# Patient Record
Sex: Female | Born: 1935
Health system: Southern US, Community
[De-identification: ages and names within clinical notes are randomized; demographics above are authoritative.]

## PROBLEM LIST (undated history)

## (undated) ENCOUNTER — Emergency Department (HOSPITAL_COMMUNITY): Admission: EM | Payer: Medicare Other | Source: Home / Self Care

## (undated) DIAGNOSIS — R0602 Shortness of breath: Secondary | ICD-10-CM

## (undated) DIAGNOSIS — M1990 Unspecified osteoarthritis, unspecified site: Secondary | ICD-10-CM

## (undated) DIAGNOSIS — T8859XA Other complications of anesthesia, initial encounter: Secondary | ICD-10-CM

## (undated) DIAGNOSIS — M1611 Unilateral primary osteoarthritis, right hip: Secondary | ICD-10-CM

## (undated) DIAGNOSIS — F419 Anxiety disorder, unspecified: Secondary | ICD-10-CM

## (undated) DIAGNOSIS — T4145XA Adverse effect of unspecified anesthetic, initial encounter: Secondary | ICD-10-CM

## (undated) DIAGNOSIS — K219 Gastro-esophageal reflux disease without esophagitis: Secondary | ICD-10-CM

## (undated) DIAGNOSIS — M1710 Unilateral primary osteoarthritis, unspecified knee: Secondary | ICD-10-CM

## (undated) DIAGNOSIS — R42 Dizziness and giddiness: Secondary | ICD-10-CM

## (undated) DIAGNOSIS — H6690 Otitis media, unspecified, unspecified ear: Secondary | ICD-10-CM

## (undated) DIAGNOSIS — S52502A Unspecified fracture of the lower end of left radius, initial encounter for closed fracture: Secondary | ICD-10-CM

## (undated) HISTORY — PX: EYE SURGERY: SHX253

## (undated) HISTORY — PX: BACK SURGERY: SHX140

---

## 2001-01-20 ENCOUNTER — Encounter: Payer: Self-pay | Admitting: Emergency Medicine

## 2001-01-20 ENCOUNTER — Emergency Department (HOSPITAL_COMMUNITY): Admission: EM | Admit: 2001-01-20 | Discharge: 2001-01-21 | Payer: Self-pay | Admitting: Emergency Medicine

## 2003-10-17 ENCOUNTER — Ambulatory Visit (HOSPITAL_COMMUNITY): Admission: RE | Admit: 2003-10-17 | Discharge: 2003-10-17 | Payer: Self-pay | Admitting: Family Medicine

## 2003-12-31 ENCOUNTER — Ambulatory Visit (HOSPITAL_COMMUNITY): Admission: RE | Admit: 2003-12-31 | Discharge: 2003-12-31 | Payer: Self-pay | Admitting: Ophthalmology

## 2005-11-17 ENCOUNTER — Emergency Department (HOSPITAL_COMMUNITY): Admission: EM | Admit: 2005-11-17 | Discharge: 2005-11-17 | Payer: Self-pay | Admitting: Emergency Medicine

## 2005-12-01 ENCOUNTER — Ambulatory Visit (HOSPITAL_COMMUNITY): Admission: RE | Admit: 2005-12-01 | Discharge: 2005-12-01 | Payer: Self-pay | Admitting: Family Medicine

## 2007-06-23 ENCOUNTER — Ambulatory Visit (HOSPITAL_COMMUNITY): Admission: RE | Admit: 2007-06-23 | Discharge: 2007-06-23 | Payer: Self-pay | Admitting: Ophthalmology

## 2008-03-18 ENCOUNTER — Emergency Department (HOSPITAL_COMMUNITY): Admission: EM | Admit: 2008-03-18 | Discharge: 2008-03-19 | Payer: Self-pay | Admitting: Emergency Medicine

## 2010-03-28 ENCOUNTER — Emergency Department (HOSPITAL_COMMUNITY): Admission: EM | Admit: 2010-03-28 | Discharge: 2010-03-29 | Payer: Self-pay | Admitting: Emergency Medicine

## 2010-07-15 LAB — URINALYSIS, ROUTINE W REFLEX MICROSCOPIC
Bilirubin Urine: NEGATIVE
Glucose, UA: NEGATIVE mg/dL
Specific Gravity, Urine: 1.02 (ref 1.005–1.030)
pH: 5.5 (ref 5.0–8.0)

## 2010-07-15 LAB — BASIC METABOLIC PANEL
Chloride: 102 mEq/L (ref 96–112)
GFR calc non Af Amer: 53 mL/min — ABNORMAL LOW (ref >60)
Glucose, Bld: 128 mg/dL — ABNORMAL HIGH (ref 70–99)
Potassium: 3.8 mEq/L (ref 3.5–5.1)
Sodium: 137 mEq/L (ref 135–145)

## 2010-07-15 LAB — POCT CARDIAC MARKERS: Myoglobin, poc: 60.6 ng/mL (ref 12–200)

## 2010-07-15 LAB — CBC
HCT: 44.2 % (ref 36.0–46.0)
Hemoglobin: 15.5 g/dL — ABNORMAL HIGH (ref 12.0–15.0)
WBC: 9.4 10*3/uL (ref 4.0–10.5)

## 2010-07-15 LAB — URINE MICROSCOPIC-ADD ON

## 2010-07-15 LAB — DIFFERENTIAL
Lymphocytes Relative: 23 % (ref 12–46)
Monocytes Absolute: 0.6 10*3/uL (ref 0.1–1.0)
Monocytes Relative: 6 % (ref 3–12)
Neutro Abs: 6.2 10*3/uL (ref 1.7–7.7)

## 2011-01-23 LAB — BASIC METABOLIC PANEL
CO2: 31
Calcium: 10.4
Glucose, Bld: 103 — ABNORMAL HIGH
Sodium: 142

## 2011-02-03 LAB — URINALYSIS, ROUTINE W REFLEX MICROSCOPIC
Bilirubin Urine: NEGATIVE
Hgb urine dipstick: NEGATIVE
Ketones, ur: NEGATIVE
Protein, ur: NEGATIVE
Urobilinogen, UA: 0.2

## 2011-02-03 LAB — CBC
Hemoglobin: 14.4
MCHC: 34
MCV: 91.3
RBC: 4.65

## 2011-02-03 LAB — DIFFERENTIAL
Basophils Relative: 1
Eosinophils Absolute: 0.4
Eosinophils Relative: 6 — ABNORMAL HIGH
Monocytes Absolute: 0.5
Monocytes Relative: 8
Neutrophils Relative %: 52

## 2011-02-03 LAB — BASIC METABOLIC PANEL
CO2: 26
Chloride: 104
Creatinine, Ser: 1.07
GFR calc Af Amer: >60

## 2011-04-16 ENCOUNTER — Ambulatory Visit (HOSPITAL_COMMUNITY)
Admission: RE | Admit: 2011-04-16 | Discharge: 2011-04-16 | Disposition: A | Discharge status: Home / Self Care | Payer: Medicare Other | Source: Ambulatory Visit | Attending: Family Medicine | Admitting: Family Medicine

## 2011-04-16 ENCOUNTER — Other Ambulatory Visit (HOSPITAL_COMMUNITY): Payer: Self-pay | Admitting: Family Medicine

## 2011-04-16 DIAGNOSIS — Z23 Encounter for immunization: Secondary | ICD-10-CM

## 2011-04-16 DIAGNOSIS — I10 Essential (primary) hypertension: Secondary | ICD-10-CM

## 2011-04-16 DIAGNOSIS — E785 Hyperlipidemia, unspecified: Secondary | ICD-10-CM

## 2011-04-16 DIAGNOSIS — M899 Disorder of bone, unspecified: Secondary | ICD-10-CM | POA: Insufficient documentation

## 2011-04-16 DIAGNOSIS — Z780 Asymptomatic menopausal state: Secondary | ICD-10-CM | POA: Insufficient documentation

## 2011-04-16 DIAGNOSIS — M949 Disorder of cartilage, unspecified: Secondary | ICD-10-CM | POA: Insufficient documentation

## 2011-06-19 DIAGNOSIS — H26499 Other secondary cataract, unspecified eye: Secondary | ICD-10-CM | POA: Diagnosis not present

## 2011-06-19 DIAGNOSIS — H5200 Hypermetropia, unspecified eye: Secondary | ICD-10-CM | POA: Diagnosis not present

## 2011-06-19 DIAGNOSIS — H52229 Regular astigmatism, unspecified eye: Secondary | ICD-10-CM | POA: Diagnosis not present

## 2011-06-19 DIAGNOSIS — H524 Presbyopia: Secondary | ICD-10-CM | POA: Diagnosis not present

## 2011-07-20 DIAGNOSIS — I10 Essential (primary) hypertension: Secondary | ICD-10-CM | POA: Diagnosis not present

## 2011-07-20 DIAGNOSIS — E785 Hyperlipidemia, unspecified: Secondary | ICD-10-CM | POA: Diagnosis not present

## 2011-08-17 DIAGNOSIS — M169 Osteoarthritis of hip, unspecified: Secondary | ICD-10-CM | POA: Diagnosis not present

## 2011-08-17 DIAGNOSIS — Z6829 Body mass index (BMI) 29.0-29.9, adult: Secondary | ICD-10-CM | POA: Diagnosis not present

## 2011-08-17 DIAGNOSIS — E782 Mixed hyperlipidemia: Secondary | ICD-10-CM | POA: Diagnosis not present

## 2011-08-17 DIAGNOSIS — M25559 Pain in unspecified hip: Secondary | ICD-10-CM | POA: Diagnosis not present

## 2012-01-19 DIAGNOSIS — E782 Mixed hyperlipidemia: Secondary | ICD-10-CM | POA: Diagnosis not present

## 2012-02-25 ENCOUNTER — Other Ambulatory Visit (HOSPITAL_COMMUNITY): Payer: Self-pay | Admitting: Family Medicine

## 2012-02-25 ENCOUNTER — Ambulatory Visit (HOSPITAL_COMMUNITY)
Admission: RE | Admit: 2012-02-25 | Discharge: 2012-02-25 | Disposition: A | Discharge status: Home / Self Care | Payer: Medicare Other | Source: Ambulatory Visit | Attending: Family Medicine | Admitting: Family Medicine

## 2012-02-25 DIAGNOSIS — M51379 Other intervertebral disc degeneration, lumbosacral region without mention of lumbar back pain or lower extremity pain: Secondary | ICD-10-CM | POA: Insufficient documentation

## 2012-02-25 DIAGNOSIS — M169 Osteoarthritis of hip, unspecified: Secondary | ICD-10-CM | POA: Diagnosis not present

## 2012-02-25 DIAGNOSIS — M25559 Pain in unspecified hip: Secondary | ICD-10-CM

## 2012-02-25 DIAGNOSIS — E119 Type 2 diabetes mellitus without complications: Secondary | ICD-10-CM | POA: Diagnosis not present

## 2012-02-25 DIAGNOSIS — M5137 Other intervertebral disc degeneration, lumbosacral region: Secondary | ICD-10-CM | POA: Insufficient documentation

## 2012-02-25 DIAGNOSIS — Z23 Encounter for immunization: Secondary | ICD-10-CM | POA: Diagnosis not present

## 2012-02-29 ENCOUNTER — Other Ambulatory Visit: Payer: Self-pay | Admitting: Sports Medicine

## 2012-02-29 DIAGNOSIS — M169 Osteoarthritis of hip, unspecified: Secondary | ICD-10-CM

## 2012-02-29 DIAGNOSIS — M545 Low back pain, unspecified: Secondary | ICD-10-CM | POA: Diagnosis not present

## 2012-03-03 ENCOUNTER — Ambulatory Visit
Admission: RE | Admit: 2012-03-03 | Discharge: 2012-03-03 | Disposition: A | Discharge status: Home / Self Care | Payer: Medicare Other | Source: Ambulatory Visit | Attending: Sports Medicine | Admitting: Sports Medicine

## 2012-03-03 DIAGNOSIS — M1610 Unilateral primary osteoarthritis, unspecified hip: Secondary | ICD-10-CM | POA: Diagnosis not present

## 2012-03-03 DIAGNOSIS — M169 Osteoarthritis of hip, unspecified: Secondary | ICD-10-CM

## 2012-03-03 DIAGNOSIS — M25559 Pain in unspecified hip: Secondary | ICD-10-CM | POA: Diagnosis not present

## 2012-03-03 MED ORDER — METHYLPREDNISOLONE ACETATE 40 MG/ML INJ SUSP (RADIOLOG
120.0000 mg | Freq: Once | INTRAMUSCULAR | Status: AC
  Administered 2012-03-03: 120 mg via INTRA_ARTICULAR

## 2012-03-03 MED ORDER — IOHEXOL 180 MG/ML  SOLN
1.0000 mL | Freq: Once | INTRAMUSCULAR | Status: AC | PRN
  Administered 2012-03-03: 1 mL via INTRA_ARTICULAR

## 2012-03-22 ENCOUNTER — Other Ambulatory Visit: Payer: Self-pay | Admitting: Sports Medicine

## 2012-03-22 DIAGNOSIS — M169 Osteoarthritis of hip, unspecified: Secondary | ICD-10-CM

## 2012-04-19 DIAGNOSIS — I10 Essential (primary) hypertension: Secondary | ICD-10-CM | POA: Diagnosis not present

## 2012-04-19 DIAGNOSIS — E785 Hyperlipidemia, unspecified: Secondary | ICD-10-CM | POA: Diagnosis not present

## 2012-04-19 DIAGNOSIS — Z6829 Body mass index (BMI) 29.0-29.9, adult: Secondary | ICD-10-CM | POA: Diagnosis not present

## 2012-04-19 DIAGNOSIS — R7301 Impaired fasting glucose: Secondary | ICD-10-CM | POA: Diagnosis not present

## 2012-05-03 ENCOUNTER — Ambulatory Visit
Admission: RE | Admit: 2012-05-03 | Discharge: 2012-05-03 | Disposition: A | Discharge status: Home / Self Care | Payer: Medicare Other | Source: Ambulatory Visit | Attending: Sports Medicine | Admitting: Sports Medicine

## 2012-05-03 DIAGNOSIS — M169 Osteoarthritis of hip, unspecified: Secondary | ICD-10-CM

## 2012-05-03 DIAGNOSIS — M25559 Pain in unspecified hip: Secondary | ICD-10-CM | POA: Diagnosis not present

## 2012-05-03 MED ORDER — IOHEXOL 180 MG/ML  SOLN
1.0000 mL | Freq: Once | INTRAMUSCULAR | Status: AC | PRN
  Administered 2012-05-03: 1 mL via INTRA_ARTICULAR

## 2012-05-03 MED ORDER — METHYLPREDNISOLONE ACETATE 40 MG/ML INJ SUSP (RADIOLOG
120.0000 mg | Freq: Once | INTRAMUSCULAR | Status: AC
  Administered 2012-05-03: 120 mg via INTRA_ARTICULAR

## 2012-06-23 DIAGNOSIS — M169 Osteoarthritis of hip, unspecified: Secondary | ICD-10-CM | POA: Diagnosis not present

## 2012-07-04 ENCOUNTER — Emergency Department (HOSPITAL_COMMUNITY): Payer: Medicare Other

## 2012-07-04 ENCOUNTER — Encounter (HOSPITAL_COMMUNITY): Payer: Self-pay | Admitting: *Deleted

## 2012-07-04 ENCOUNTER — Emergency Department (HOSPITAL_COMMUNITY)
Admission: EM | Admit: 2012-07-04 | Discharge: 2012-07-04 | Disposition: A | Discharge status: Home / Self Care | Payer: Medicare Other | Source: Home / Self Care | Attending: Emergency Medicine | Admitting: Emergency Medicine

## 2012-07-04 DIAGNOSIS — Z79899 Other long term (current) drug therapy: Secondary | ICD-10-CM | POA: Diagnosis not present

## 2012-07-04 DIAGNOSIS — G4733 Obstructive sleep apnea (adult) (pediatric): Secondary | ICD-10-CM | POA: Diagnosis not present

## 2012-07-04 DIAGNOSIS — R52 Pain, unspecified: Secondary | ICD-10-CM | POA: Diagnosis not present

## 2012-07-04 DIAGNOSIS — Z4789 Encounter for other orthopedic aftercare: Secondary | ICD-10-CM | POA: Diagnosis not present

## 2012-07-04 DIAGNOSIS — S52599A Other fractures of lower end of unspecified radius, initial encounter for closed fracture: Secondary | ICD-10-CM | POA: Diagnosis not present

## 2012-07-04 DIAGNOSIS — Z7901 Long term (current) use of anticoagulants: Secondary | ICD-10-CM | POA: Diagnosis not present

## 2012-07-04 DIAGNOSIS — R262 Difficulty in walking, not elsewhere classified: Secondary | ICD-10-CM | POA: Diagnosis not present

## 2012-07-04 DIAGNOSIS — S5290XA Unspecified fracture of unspecified forearm, initial encounter for closed fracture: Secondary | ICD-10-CM | POA: Diagnosis not present

## 2012-07-04 DIAGNOSIS — M1610 Unilateral primary osteoarthritis, unspecified hip: Secondary | ICD-10-CM | POA: Diagnosis present

## 2012-07-04 DIAGNOSIS — Z471 Aftercare following joint replacement surgery: Secondary | ICD-10-CM | POA: Diagnosis not present

## 2012-07-04 DIAGNOSIS — W19XXXA Unspecified fall, initial encounter: Secondary | ICD-10-CM | POA: Diagnosis not present

## 2012-07-04 DIAGNOSIS — S52502A Unspecified fracture of the lower end of left radius, initial encounter for closed fracture: Secondary | ICD-10-CM

## 2012-07-04 DIAGNOSIS — R279 Unspecified lack of coordination: Secondary | ICD-10-CM | POA: Diagnosis not present

## 2012-07-04 DIAGNOSIS — S52539A Colles' fracture of unspecified radius, initial encounter for closed fracture: Secondary | ICD-10-CM | POA: Diagnosis not present

## 2012-07-04 DIAGNOSIS — Z01818 Encounter for other preprocedural examination: Secondary | ICD-10-CM | POA: Diagnosis not present

## 2012-07-04 DIAGNOSIS — Z96649 Presence of unspecified artificial hip joint: Secondary | ICD-10-CM | POA: Diagnosis not present

## 2012-07-04 DIAGNOSIS — M25539 Pain in unspecified wrist: Secondary | ICD-10-CM | POA: Diagnosis not present

## 2012-07-04 DIAGNOSIS — IMO0002 Reserved for concepts with insufficient information to code with codable children: Secondary | ICD-10-CM | POA: Diagnosis not present

## 2012-07-04 HISTORY — DX: Unspecified osteoarthritis, unspecified site: M19.90

## 2012-07-04 MED ORDER — OXYCODONE-ACETAMINOPHEN 5-325 MG PO TABS
1.0000 | ORAL_TABLET | Freq: Once | ORAL | Status: AC
  Administered 2012-07-04: 1 via ORAL
  Filled 2012-07-04: qty 1

## 2012-07-04 MED ORDER — OXYCODONE-ACETAMINOPHEN 5-325 MG PO TABS: 1.0000 | ORAL_TABLET | ORAL | Status: DC | PRN

## 2012-07-04 MED ORDER — FENTANYL CITRATE 0.05 MG/ML IJ SOLN
100.0000 ug | Freq: Once | INTRAMUSCULAR | Status: AC
  Administered 2012-07-04: 50 ug via NASAL
  Filled 2012-07-04: qty 2

## 2012-07-04 NOTE — ED Provider Notes (Signed)
History     CSN: [id number]  Arrival date & time 07/04/12  1454   First MD Initiated Contact with Patient 07/04/12 1530      Chief Complaint  Patient presents with  . Wrist Injury     Patient is a 79 y.o. female presenting with wrist injury. The history is provided by the patient.  Wrist Injury Location:  Wrist Time since incident:  2 hours Wrist location:  L wrist Pain details:    Quality:  Aching   Radiates to:  Does not radiate   Severity:  Moderate   Onset quality:  Sudden   Timing:  Constant   Progression:  Worsening Chronicity:  New Dislocation: no   Relieved by:  Rest Worsened by:  Movement Associated symptoms: no back pain, no fatigue and no neck pain   pt reports she was getting off couch and fell.  No head or neck injury.  No cp/sob.  No dizziness reported.  She reports she injured her left wrist.  She is scheduled to have hip surgery later this month  Past Medical History  Diagnosis Date  . Arthritis     Past Surgical History  Procedure Laterality Date  . Back surgery      No family history on file.  History  Substance Use Topics  . Smoking status: Never Smoker   . Smokeless tobacco: Not on file  . Alcohol Use: No    OB History   Grav Para Term Preterm Abortions TAB SAB Ect Mult Living                  Review of Systems  Constitutional: Negative for fatigue.  HENT: Negative for neck pain.   Respiratory: Negative for shortness of breath.   Cardiovascular: Negative for chest pain.  Gastrointestinal: Negative for abdominal pain.  Musculoskeletal: Negative for back pain.  Neurological: Negative for dizziness, syncope and weakness.  Psychiatric/Behavioral: Negative for agitation.  All other systems reviewed and are negative.    Allergies  Review of patient's allergies indicates no known allergies.  Home Medications  No current outpatient prescriptions on file.  BP 156/65  Pulse 110  Temp(Src) 97.9 F (36.6 C) (Oral)  Resp 18  Ht  5\' 6"  (1.676 m)  Wt 180 lb (81.647 kg)  BMI 29.07 kg/m2  SpO2 98%  Physical Exam CONSTITUTIONAL: Well developed/well nourished HEAD: Normocephalic/atraumatic EYES: EOMI/PERRL ENMT: Mucous membranes moist NECK: supple no meningeal signs SPINE:entire spine nontender CV: S1/S2 noted, no murmurs/rubs/gallops noted LUNGS: Lungs are clear to auscultation bilaterally, no apparent distress ABDOMEN: soft, nontender, no rebound or guarding GU:no cva tenderness NEURO: Pt is awake/alert, moves all extremitiesx4 EXTREMITIES: pulses normal, full ROM. Left wrist tenderness and deformity noted.  No laceration or abrasions.  Pulses are intact.  Distal radial/media/ulnar motor/sensory intact on left UE.  All other extremities/joints palpated/ranged and nontender SKIN: warm, color normal PSYCH: no abnormalities of mood noted  ED Course  Procedures (including critical care time)  Labs Reviewed - No data to display Dg Wrist Complete Left  07/04/2012  *RADIOLOGY REPORT*  Clinical Data: Injury, left wrist pain post fall today  LEFT WRIST - COMPLETE 3+ VIEW  Comparison: None  Findings: Osseous demineralization. Transverse metaphyseal fracture distal left radius with dorsal displacement and apex volar angulation. Probable intra-articular extension into radiocarpal joint near dorsal margin. No additional fracture or dislocation identified. Degenerative changes at the STT joint. Acquired ulnar plus variance.  IMPRESSION: Displaced angulated and likely intra-articular distal right radial metaphyseal fracture.  Original Report Authenticated By: Ulyses Southward, M.D.      No diagnosis found.    MDM  xrays reviewed and considered Nursing notes including past medical history and social history reviewed and considered in documentation   Pt does have orthopedist in [place] but she requested I speak to local orthopedist due to poor weather and she feels she can not get to [place] I spoke to dr Hilda Lias who reviewed  imaging.  He requests I place sugartong and he will see in the morning.  He does not feel acute reduction is needed.   Pt tolerated sugartong well.  No other injury noted.  No new hip injury noted.  Denies LOC and this was likely mechanical fall.         Joya Gaskins, MD 07/04/12 [phone]

## 2012-07-04 NOTE — ED Notes (Signed)
Fell off sofa at home, left wrist pain

## 2012-07-05 ENCOUNTER — Inpatient Hospital Stay (HOSPITAL_COMMUNITY): Payer: Medicare Other

## 2012-07-05 ENCOUNTER — Inpatient Hospital Stay (HOSPITAL_COMMUNITY)
Admission: AD | Admit: 2012-07-05 | Discharge: 2012-07-11 | DRG: 470 | Disposition: A | Discharge status: Skilled Nursing Facility | Payer: Medicare Other | Source: Ambulatory Visit | Attending: Orthopedic Surgery | Admitting: Orthopedic Surgery

## 2012-07-05 ENCOUNTER — Encounter (HOSPITAL_COMMUNITY): Payer: Self-pay | Admitting: Neurology

## 2012-07-05 ENCOUNTER — Other Ambulatory Visit: Payer: Self-pay | Admitting: Orthopedic Surgery

## 2012-07-05 DIAGNOSIS — M1612 Unilateral primary osteoarthritis, left hip: Secondary | ICD-10-CM | POA: Diagnosis present

## 2012-07-05 DIAGNOSIS — S52502A Unspecified fracture of the lower end of left radius, initial encounter for closed fracture: Secondary | ICD-10-CM

## 2012-07-05 DIAGNOSIS — S52539A Colles' fracture of unspecified radius, initial encounter for closed fracture: Secondary | ICD-10-CM | POA: Diagnosis not present

## 2012-07-05 DIAGNOSIS — Z79899 Other long term (current) drug therapy: Secondary | ICD-10-CM

## 2012-07-05 DIAGNOSIS — Z01818 Encounter for other preprocedural examination: Secondary | ICD-10-CM | POA: Diagnosis not present

## 2012-07-05 DIAGNOSIS — W010XXA Fall on same level from slipping, tripping and stumbling without subsequent striking against object, initial encounter: Secondary | ICD-10-CM | POA: Diagnosis present

## 2012-07-05 DIAGNOSIS — S52599A Other fractures of lower end of unspecified radius, initial encounter for closed fracture: Principal | ICD-10-CM | POA: Diagnosis present

## 2012-07-05 DIAGNOSIS — M1610 Unilateral primary osteoarthritis, unspecified hip: Secondary | ICD-10-CM | POA: Diagnosis not present

## 2012-07-05 DIAGNOSIS — Z7901 Long term (current) use of anticoagulants: Secondary | ICD-10-CM

## 2012-07-05 HISTORY — DX: Unilateral primary osteoarthritis, unspecified knee: M17.10

## 2012-07-05 HISTORY — DX: Unspecified fracture of the lower end of left radius, initial encounter for closed fracture: S52.502A

## 2012-07-05 LAB — BASIC METABOLIC PANEL
BUN: 17 mg/dL (ref 6–23)
Chloride: 102 mEq/L (ref 96–112)
Creatinine, Ser: 0.98 mg/dL (ref 0.50–1.10)
Glucose, Bld: 122 mg/dL — ABNORMAL HIGH (ref 70–99)
Potassium: 3.7 mEq/L (ref 3.5–5.1)

## 2012-07-05 LAB — CBC
HCT: 43.6 % (ref 36.0–46.0)
Hemoglobin: 15.1 g/dL — ABNORMAL HIGH (ref 12.0–15.0)
MCH: 30.9 pg (ref 26.0–34.0)
MCHC: 34.6 g/dL (ref 30.0–36.0)
MCV: 89.2 fL (ref 78.0–100.0)
RDW: 13.2 % (ref 11.5–15.5)

## 2012-07-05 MED ORDER — POTASSIUM CHLORIDE IN NACL 20-0.45 MEQ/L-% IV SOLN
INTRAVENOUS | Status: DC
  Administered 2012-07-05 – 2012-07-07 (×2): via INTRAVENOUS
  Filled 2012-07-05 (×5): qty 1000

## 2012-07-05 MED ORDER — ACETAMINOPHEN 650 MG RE SUPP: 650.0000 mg | Freq: Four times a day (QID) | RECTAL | Status: DC | PRN

## 2012-07-05 MED ORDER — CEFAZOLIN SODIUM-DEXTROSE 2-3 GM-% IV SOLR
2.0000 g | INTRAVENOUS | Status: AC
  Administered 2012-07-07: 2 g via INTRAVENOUS
  Filled 2012-07-05: qty 50

## 2012-07-05 MED ORDER — ACETAMINOPHEN 325 MG PO TABS: 650.0000 mg | ORAL_TABLET | Freq: Four times a day (QID) | ORAL | Status: DC | PRN

## 2012-07-05 MED ORDER — HYDROCODONE-ACETAMINOPHEN 5-325 MG PO TABS
1.0000 | ORAL_TABLET | ORAL | Status: DC | PRN
  Administered 2012-07-05 – 2012-07-06 (×6): 2 via ORAL
  Filled 2012-07-05 (×6): qty 2

## 2012-07-05 MED ORDER — MORPHINE SULFATE 2 MG/ML IJ SOLN
1.0000 mg | INTRAMUSCULAR | Status: DC | PRN
  Administered 2012-07-05 – 2012-07-06 (×5): 1 mg via INTRAVENOUS
  Filled 2012-07-05 (×6): qty 1

## 2012-07-05 NOTE — H&P (Signed)
  PREOPERATIVE H&P  Chief Complaint: left wrist pain, back pain, bilateral hip arthritis  HPI: Ruth Ford is a 79 y.o. female who fell off a sofa last  Night with 10/10 pain over her back, left wrist, with chronic bilateral hip pain.  She lives alone and can not care for herself and has been taking hydrocodone with inadequate pain control.  Denies LOC.  Wishes inpatient management for pain control and assistance with ambulation.  Past Medical History  Diagnosis Date  . Arthritis    Past Surgical History  Procedure Laterality Date  . Back surgery     History   Social History  . Marital Status: Divorced    Spouse Name: N/A    Number of Children: N/A  . Years of Education: N/A   Social History Main Topics  . Smoking status: Never Smoker   . Smokeless tobacco: Not on file  . Alcohol Use: No  . Drug Use: Not on file  . Sexually Active: Not on file   Other Topics Concern  . Not on file   Social History Narrative  . No narrative on file   No family history on file. No Known Allergies Prior to Admission medications   Medication Sig Start Date End Date Taking? Authorizing Maridee Slape  famotidine-calcium carbonate-magnesium hydroxide (PEPCID COMPLETE) [phone] MG CHEW Chew 1 tablet by mouth daily as needed.    Historical Briseyda Fehr, MD  fish oil-omega-3 fatty acids 1000 MG capsule Take 1-2 g by mouth daily.    Historical Otis Portal, MD  hydrochlorothiazide (HYDRODIURIL) 25 MG tablet Take 25 mg by mouth daily.    Historical Cachet Mccutchen, MD  oxyCODONE-acetaminophen (PERCOCET/ROXICET) 5-325 MG per tablet Take 1 tablet by mouth every 4 (four) hours as needed for pain. 07/04/12   Joya Gaskins, MD     Positive ROS: All other systems have been reviewed and were otherwise negative with the exception of those mentioned in the HPI and as above.  Physical Exam: General: Alert, no acute distress Cardiovascular: No pedal edema Respiratory: No cyanosis, no use of accessory musculature GI:  No organomegaly, abdomen is soft and non-tender Skin: No lesions in the area of chief complaint Neurologic: Sensation intact distally Psychiatric: Patient is competent for consent with normal mood and affect Lymphatic: No axillary or cervical lymphadenopathy  MUSCULOSKELETAL: left wrist has gross deformity, sensation intact, severe pain to palpation, back has severe pain in midline, pain with restricted ROM of both hips.  Assessment: Comminuted displaced distal radius fracture, bilateral hip OA, severe uncontrolled pain on po meds, back pain, possible nondisplaced coccyx fracture, failure to thrive at home.  Plan: Plan for admission, IV pain meds for pain control, npo after midnight add on for ORIF wrist tomorrow.  PT/OT for ambulation and safety.  The risks benefits and alternatives were discussed with the patient including but not limited to the risks of nonoperative treatment, versus surgical intervention including infection, bleeding, nerve injury,  blood clots, cardiopulmonary complications, morbidity, mortality, among others, and they were willing to proceed.   LANDAU,JOSHUA P, MD Cell [phone] Pager [phone]  07/05/2012 5:14 PM

## 2012-07-06 MED ORDER — ENOXAPARIN SODIUM 40 MG/0.4ML ~~LOC~~ SOLN
40.0000 mg | SUBCUTANEOUS | Status: AC
  Administered 2012-07-06: 40 mg via SUBCUTANEOUS
  Filled 2012-07-06: qty 0.4

## 2012-07-06 NOTE — Progress Notes (Signed)
     Subjective:  Patient reports pain as marked.  She says that her fall was because she was not able to balance due to her hip arthritis. She has severe hip pain as well as wrist pain.  Objective:   VITALS:   Filed Vitals:   07/05/12 1944 07/05/12 2110 07/06/12 0643  BP: 146/67 137/77 110/56  Pulse: 97 100 86  Temp: 98.2 F (36.8 C) 98 F (36.7 C) 98.2 F (36.8 C)  TempSrc: Oral Oral Oral  Resp: 18 18 16   Height: 5\' 5"  (1.651 m)    Weight: 83.3 kg (183 lb 10.3 oz)    SpO2: 97% 98% 94%    Neurologically intact Dorsiflexion/Plantar flexion intact   Lab Results  Component Value Date   WBC 11.7* 07/05/2012   HGB 15.1* 07/05/2012   HCT 43.6 07/05/2012   MCV 89.2 07/05/2012   PLT 230 07/05/2012     Assessment/Plan:     Severe hip osteoarthritis, acute distal radius fracture, back pain, inability to care for herself at home.  She was originally scheduled to have her hip replaced in approximately 2 weeks. Her hip arthritis is impairing her mobility, and has also increased her risk for falls, resulting in this fracture. We discussed the different options. She would like to get her hip fixed as well as her wrist fixed at the same time, and optimize her long-term recovery. I have discussed that she is at risk for falls, but will be at risk for falls either way.  The risks benefits and alternatives were discussed with the patient including but not limited to the risks of nonoperative treatment, versus surgical intervention including infection, bleeding, nerve injury, periprosthetic fracture, the need for revision surgery, dislocation, leg length discrepancy, blood clots, cardiopulmonary complications, morbidity, mortality, among others, and they were willing to proceed.  We've also discussed the risks for posttraumatic arthritis of the wrist, as well as hardware complications, tendon rupture, among others. She would like to proceed.  We are going to schedule surgery for tomorrow, probably  approximately 11 AM. We will give her 1 dose of Lovenox today, and have her be n.p.o. after midnight tonight.      LANDAU,JOSHUA P 07/06/2012, 8:41 AM   Teryl Lucy, MD Cell [phone] Pager [phone]

## 2012-07-06 NOTE — Evaluation (Signed)
Physical Therapy Evaluation Patient Details Name: Ruth Ford MRN: [id number] DOB: May 28, 1933 Today's Date: 07/06/2012 Time: 4098-1191 PT Time Calculation (min): 23 min  PT Assessment / Plan / Recommendation Clinical Impression  Pt is 79 yo female who fell off a couch and suffered L distal radius fx. Pt was also schedule for L THA at end of month due to severe arthrits. Pt mobilizing very well this date. Pt planned to go to OR tomorrow 3/6 for ORIF to L wrist and L THA. PT to re-assess patient on 3/7 or when approved by MD. Pt will most likely need a platform walker and rehab upon d/c to achieve mod I function prior to returning home alone. Pt motivated and anticipate pt to do well after surgery.    PT Assessment  Patient needs continued PT services    Follow Up Recommendations  Supervision/Assistance - 24 hour;SNF    Does the patient have the potential to tolerate intense rehabilitation      Barriers to Discharge Decreased caregiver support pt lives alone    Equipment Recommendations   (TBD)    Recommendations for Other Services     Frequency Min 5X/week    Precautions / Restrictions Precautions Precautions: Fall Restrictions Weight Bearing Restrictions: Yes LUE Weight Bearing: Weight bear through elbow only Other Position/Activity Restrictions: pt in L UE splint   Pertinent Vitals/Pain Pt reports "it's not to bad" in reference to L hip and L wrist      Mobility  Bed Mobility Bed Mobility: Supine to Sit Supine to Sit: 4: Min assist;HOB elevated Sitting - Scoot to Edge of Bed: 4: Min assist Details for Bed Mobility Assistance: assist for trunk elevation Transfers Transfers: Sit to Stand;Stand to [employer] Transfers Sit to Stand: With upper extremity assist;From bed;4: Min guard Stand to Sit: 4: Min guard;With upper extremity assist;To chair/3-in-1 Stand Pivot Transfers: 4: Min guard Details for Transfer Assistance: pt steady on feet, v/c's to not use L  UE Ambulation/Gait Ambulation/Gait Assistance: 4: Min guard Ambulation Distance (Feet): 100 Feet Assistive device:  (pt pushed IV pole) Ambulation/Gait Assistance Details: pt with no LOB.  Gait Pattern: Within Functional Limits Gait velocity: wfl General Gait Details: pushed IV pole with R UE Stairs: No Wheelchair Mobility Wheelchair Mobility: No    Exercises     PT Diagnosis: Difficulty walking;Acute pain  PT Problem List: Decreased balance;Decreased mobility;Decreased range of motion;Decreased activity tolerance PT Treatment Interventions: Gait training;Functional mobility training;Therapeutic activities;Therapeutic exercise;Balance training;Stair training;DME instruction   PT Goals Acute Rehab PT Goals PT Goal Formulation: With patient Time For Goal Achievement: 07/20/12 Potential to Achieve Goals: Good Pt will go Supine/Side to Sit: with supervision;with HOB 0 degrees PT Goal: Supine/Side to Sit - Progress: Goal set today Pt will go Sit to Supine/Side: with supervision;with HOB 0 degrees PT Goal: Sit to Supine/Side - Progress: Goal set today Pt will go Sit to Stand: with supervision PT Goal: Sit to Stand - Progress: Goal set today Pt will Ambulate: >150 feet;with supervision;with least restrictive assistive device PT Goal: Ambulate - Progress: Goal set today Pt will Go Up / Down Stairs: 3-5 stairs;with min assist PT Goal: Up/Down Stairs - Progress: Goal set today  Visit Information  Last PT Received On: 07/06/12 Assistance Needed: +1 PT/OT Co-Evaluation/Treatment: Yes    Subjective Data  Subjective: Pt received supine in bed with request to use BSC.   Prior Functioning  Home Living Lives With: Alone Available Help at Discharge: Family;Available 24 hours/day (niece) Type of Home:  House Home Access: Stairs to enter Secretary/administrator of Steps: 3 Entrance Stairs-Rails: Right;Left Home Layout: One level Bathroom Shower/Tub: Pension scheme manager: Standard Bathroom Accessibility: Yes How Accessible: Accessible via walker Home Adaptive Equipment: Walker - standard Prior Function Level of Independence: Independent Able to Take Stairs?: Yes Driving: Yes Vocation: Retired Musician: No difficulties Dominant Hand: Right    Cognition  Cognition Overall Cognitive Status: Appears within functional limits for tasks assessed/performed Arousal/Alertness: Awake/alert Orientation Level: Oriented X4 / Intact Behavior During Session: WFL for tasks performed    Extremity/Trunk Assessment Right Upper Extremity Assessment RUE ROM/Strength/Tone: WFL for tasks assessed RUE Sensation: WFL - Light Touch RUE Coordination: WFL - gross/fine motor Left Upper Extremity Assessment LUE ROM/Strength/Tone: Deficits LUE ROM/Strength/Tone Deficits: no ROM to wrist due to splint and fx otherwise WFL LUE Sensation: WFL - Light Touch Right Lower Extremity Assessment RLE ROM/Strength/Tone: WFL for tasks assessed RLE Sensation: WFL - Light Touch Left Lower Extremity Assessment LLE ROM/Strength/Tone: WFL for tasks assessed LLE Sensation: WFL - Light Touch Trunk Assessment Trunk Assessment: Normal   Balance Balance Balance Assessed: Yes Static Sitting Balance Static Sitting - Balance Support: No upper extremity supported Static Sitting - Level of Assistance: 5: Stand by assistance Static Sitting - Comment/# of Minutes: 3 Static Standing Balance Static Standing - Balance Support: No upper extremity supported Static Standing - Level of Assistance: 5: Stand by assistance  End of Session PT - End of Session Equipment Utilized During Treatment: Gait belt Activity Tolerance: Patient tolerated treatment well Patient left:  (with OT by sink) Nurse Communication: Mobility status  GP     Marcene Brawn 07/06/2012, 11:50 AM  Lewis Shock, PT, DPT Pager #: [phone] Office #: [phone]

## 2012-07-06 NOTE — Progress Notes (Signed)
Patient is resting, pain is controlled.

## 2012-07-06 NOTE — Evaluation (Signed)
Occupational Therapy Evaluation Patient Details Name: Ruth Ford MRN: [id number] DOB: 15-Apr-1934 Today's Date: 07/06/2012 Time: 9147-8295 OT Time Calculation (min): 36 min  OT Assessment / Plan / Recommendation Clinical Impression  Pt admitted after attempting to stand from her couch and falling sustaining L distal radius fx and coccyx fx.  Pt has a hx of B hip OA, the L being greater and was experiencing pain and difficulty with mobility prior to admission.  Plan is for pt to have L wrist ORIF and L THA tomorrow.  Will follow acutely.  Pt wants to go to rehab prior to return home.    OT Assessment  Patient needs continued OT Services    Follow Up Recommendations  SNF    Barriers to Discharge      Equipment Recommendations  None recommended by OT    Recommendations for Other Services    Frequency  Min 2X/week    Precautions / Restrictions Precautions Precautions: Fall Restrictions Weight Bearing Restrictions: Yes LUE Weight Bearing: Weight bear through elbow only Other Position/Activity Restrictions: pt in L UE splint   Pertinent Vitals/Pain R hip chronic pain, repositioned, premedicated    ADL  Eating/Feeding: Independent Where Assessed - Eating/Feeding: Bed level Grooming: Teeth care;Wash/dry hands;Wash/dry face;Min guard Where Assessed - Grooming: Unsupported standing Upper Body Bathing: Minimal assistance Where Assessed - Upper Body Bathing: Unsupported sitting Lower Body Bathing: Minimal assistance Where Assessed - Lower Body Bathing: Unsupported sitting;Supported sit to stand Upper Body Dressing: Minimal assistance Where Assessed - Upper Body Dressing: Unsupported sitting Lower Body Dressing: Minimal assistance Where Assessed - Lower Body Dressing: Unsupported sitting;Supported sit to stand Toilet Transfer: Min Pension scheme manager Method: Surveyor, minerals: Raised toilet seat with arms (or 3-in-1 over toilet) Toileting - Clothing  Manipulation and Hygiene: Set up Where Assessed - Engineer, mining and Hygiene: Sit on 3-in-1 or toilet Equipment Used: Gait belt Transfers/Ambulation Related to ADLs: Ambulated holding IV pole with min guard assist. ADL Comments: Pt has great difficulty with LB ADL prior to fall. Mobility was compromised by pain in her L hip due to OA.    OT Diagnosis: Generalized weakness;Acute pain  OT Problem List: Decreased strength;Decreased activity tolerance;Impaired balance (sitting and/or standing);Impaired UE functional use;Pain;Obesity;Decreased knowledge of use of DME or AE OT Treatment Interventions: Self-care/ADL training;DME and/or AE instruction;Therapeutic activities;Patient/family education   OT Goals Acute Rehab OT Goals OT Goal Formulation: With patient Time For Goal Achievement: 07/20/12 Potential to Achieve Goals: Good ADL Goals Pt Will Perform Grooming: with supervision;Standing at sink ADL Goal: Grooming - Progress: Goal set today Pt Will Perform Upper Body Bathing: with supervision;Sitting, edge of bed ADL Goal: Upper Body Bathing - Progress: Goal set today Pt Will Perform Lower Body Bathing: with supervision;Sit to stand from chair;with adaptive equipment ADL Goal: Lower Body Bathing - Progress: Goal set today Pt Will Perform Upper Body Dressing: with set-up;Sitting, bed ADL Goal: Upper Body Dressing - Progress: Goal set today Pt Will Perform Lower Body Dressing: with supervision;Sit to stand from bed;with adaptive equipment ADL Goal: Lower Body Dressing - Progress: Goal set today Pt Will Transfer to Toilet: with supervision;Ambulation;3-in-1 (over toilet) ADL Goal: Toilet Transfer - Progress: Goal set today Pt Will Perform Toileting - Clothing Manipulation: with supervision;Standing Miscellaneous OT Goals Miscellaneous OT Goal #1: Pt will perform AROM of those joints not restricted by splint/cast independently. OT Goal: Miscellaneous Goal #1 - Progress: Goal  set today  Visit Information  Last OT Received On: 07/06/12 Assistance Needed: +  1 PT/OT Co-Evaluation/Treatment: Yes    Subjective Data  Subjective: "My hip hurts worse than anything." Patient Stated Goal: Rehab following L THA and L wrist ORIF tomorrow.   Prior Functioning     Home Living Lives With: Alone Available Help at Discharge: Family;Available 24 hours/day (niece) Type of Home: House Home Access: Stairs to enter [employer] of Steps: 3 Entrance Stairs-Rails: Right;Left Home Layout: One level Bathroom Shower/Tub: Forensic scientist: Standard Bathroom Accessibility: Yes How Accessible: Accessible via walker Home Adaptive Equipment: Walker - standard Prior Function Level of Independence: Independent Able to Take Stairs?: Yes Driving: Yes Vocation: Retired Musician: No difficulties Dominant Hand: Right         Vision/Perception Vision - History Patient Visual Report: No change from baseline   Cognition  Cognition Overall Cognitive Status: Appears within functional limits for tasks assessed/performed Arousal/Alertness: Awake/alert Orientation Level: Oriented X4 / Intact Behavior During Session: WFL for tasks performed    Extremity/Trunk Assessment Right Upper Extremity Assessment RUE ROM/Strength/Tone: WFL for tasks assessed RUE Sensation: WFL - Light Touch RUE Coordination: WFL - gross/fine motor Left Upper Extremity Assessment LUE ROM/Strength/Tone: Deficits LUE ROM/Strength/Tone Deficits: no ROM to wrist due to splint and fx otherwise WFL LUE Sensation: WFL - Light Touch  Trunk Assessment Trunk Assessment: Normal     Mobility Bed Mobility Bed Mobility: Supine to Sit Supine to Sit: 4: Min assist;HOB elevated Sitting - Scoot to [place] of Bed: 4: Min assist Details for Bed Mobility Assistance: assist for trunk elevation Transfers Transfers: Sit to Stand;Stand to Sit Sit to Stand: With upper  extremity assist;From bed;4: Min guard Stand to Sit: 4: Min guard;With upper extremity assist;To chair/3-in-1 Details for Transfer Assistance: pt steady on feet, v/c's to not use L UE     Exercise     Balance Balance Balance Assessed: Yes Static Sitting Balance Static Sitting - Balance Support: No upper extremity supported Static Sitting - Level of Assistance: 5: Stand by assistance Static Sitting - Comment/# of Minutes: 3 Static Standing Balance Static Standing - Balance Support: No upper extremity supported Static Standing - Level of Assistance: 5: Stand by assistance   End of Session OT - End of Session Activity Tolerance: Patient tolerated treatment well Patient left: in chair;with call bell/phone within reach;with nursing in room Nurse Communication: Mobility status  GO     Evern Bio 07/06/2012, 11:49 AM [phone]

## 2012-07-07 ENCOUNTER — Inpatient Hospital Stay (HOSPITAL_COMMUNITY): Payer: Medicare Other

## 2012-07-07 ENCOUNTER — Encounter (HOSPITAL_COMMUNITY): Payer: Self-pay | Admitting: Anesthesiology

## 2012-07-07 ENCOUNTER — Encounter (HOSPITAL_COMMUNITY)
Admission: AD | Disposition: A | Discharge status: Skilled Nursing Facility | Payer: Self-pay | Source: Ambulatory Visit | Attending: Orthopedic Surgery

## 2012-07-07 ENCOUNTER — Inpatient Hospital Stay (HOSPITAL_COMMUNITY): Payer: Medicare Other | Admitting: Anesthesiology

## 2012-07-07 DIAGNOSIS — Z7901 Long term (current) use of anticoagulants: Secondary | ICD-10-CM | POA: Diagnosis not present

## 2012-07-07 DIAGNOSIS — W19XXXA Unspecified fall, initial encounter: Secondary | ICD-10-CM | POA: Diagnosis not present

## 2012-07-07 DIAGNOSIS — IMO0002 Reserved for concepts with insufficient information to code with codable children: Secondary | ICD-10-CM | POA: Diagnosis not present

## 2012-07-07 DIAGNOSIS — Z471 Aftercare following joint replacement surgery: Secondary | ICD-10-CM | POA: Diagnosis not present

## 2012-07-07 DIAGNOSIS — S52599A Other fractures of lower end of unspecified radius, initial encounter for closed fracture: Secondary | ICD-10-CM | POA: Diagnosis not present

## 2012-07-07 DIAGNOSIS — Z79899 Other long term (current) drug therapy: Secondary | ICD-10-CM | POA: Diagnosis not present

## 2012-07-07 DIAGNOSIS — Z96649 Presence of unspecified artificial hip joint: Secondary | ICD-10-CM | POA: Diagnosis not present

## 2012-07-07 DIAGNOSIS — S5290XA Unspecified fracture of unspecified forearm, initial encounter for closed fracture: Secondary | ICD-10-CM | POA: Diagnosis not present

## 2012-07-07 DIAGNOSIS — M1610 Unilateral primary osteoarthritis, unspecified hip: Secondary | ICD-10-CM | POA: Diagnosis not present

## 2012-07-07 DIAGNOSIS — M169 Osteoarthritis of hip, unspecified: Secondary | ICD-10-CM | POA: Diagnosis not present

## 2012-07-07 HISTORY — PX: OPEN REDUCTION INTERNAL FIXATION (ORIF) DISTAL RADIAL FRACTURE: SHX5989

## 2012-07-07 HISTORY — PX: TOTAL HIP ARTHROPLASTY: SHX124

## 2012-07-07 HISTORY — PX: JOINT REPLACEMENT: SHX530

## 2012-07-07 LAB — CBC
HCT: 37.2 % (ref 36.0–46.0)
MCH: 30.1 pg (ref 26.0–34.0)
MCV: 88.2 fL (ref 78.0–100.0)
Platelets: 194 10*3/uL (ref 150–400)
RBC: 4.22 MIL/uL (ref 3.87–5.11)
RDW: 13.2 % (ref 11.5–15.5)
WBC: 10.4 10*3/uL (ref 4.0–10.5)

## 2012-07-07 LAB — CREATININE, SERUM: GFR calc Af Amer: 80 mL/min — ABNORMAL LOW (ref >90)

## 2012-07-07 LAB — PROTIME-INR: Prothrombin Time: 13.7 seconds (ref 11.6–15.2)

## 2012-07-07 SURGERY — OPEN REDUCTION INTERNAL FIXATION (ORIF) DISTAL RADIUS FRACTURE
Anesthesia: General | Site: Wrist | Laterality: Left | Wound class: Clean

## 2012-07-07 MED ORDER — LABETALOL HCL 5 MG/ML IV SOLN
INTRAVENOUS | Status: DC | PRN
  Administered 2012-07-07: 5 mg via INTRAVENOUS

## 2012-07-07 MED ORDER — LACTATED RINGERS IV SOLN
INTRAVENOUS | Status: DC
  Administered 2012-07-07: 11:00:00 via INTRAVENOUS

## 2012-07-07 MED ORDER — HYDROCHLOROTHIAZIDE 25 MG PO TABS
25.0000 mg | ORAL_TABLET | Freq: Every day | ORAL | Status: DC
  Administered 2012-07-07 – 2012-07-11 (×5): 25 mg via ORAL
  Filled 2012-07-07 (×6): qty 1

## 2012-07-07 MED ORDER — ACETAMINOPHEN 10 MG/ML IV SOLN
INTRAVENOUS | Status: AC
  Administered 2012-07-07: 1000 mg via INTRAVENOUS
  Filled 2012-07-07: qty 100

## 2012-07-07 MED ORDER — MORPHINE SULFATE 2 MG/ML IJ SOLN
1.0000 mg | INTRAMUSCULAR | Status: DC | PRN
  Administered 2012-07-07 – 2012-07-08 (×7): 1 mg via INTRAVENOUS
  Filled 2012-07-07 (×7): qty 1

## 2012-07-07 MED ORDER — WARFARIN VIDEO
Freq: Once | Status: AC
  Administered 2012-07-07: 22:00:00

## 2012-07-07 MED ORDER — WARFARIN SODIUM 5 MG PO TABS
5.0000 mg | ORAL_TABLET | Freq: Once | ORAL | Status: AC
  Administered 2012-07-07: 5 mg via ORAL
  Filled 2012-07-07: qty 1

## 2012-07-07 MED ORDER — HYDROCODONE-ACETAMINOPHEN 10-325 MG PO TABS
1.0000 | ORAL_TABLET | ORAL | Status: DC | PRN
  Administered 2012-07-07 (×2): 2 via ORAL
  Filled 2012-07-07 (×2): qty 2

## 2012-07-07 MED ORDER — PHENOL 1.4 % MT LIQD: 1.0000 | OROMUCOSAL | Status: DC | PRN

## 2012-07-07 MED ORDER — COUMADIN BOOK
Freq: Once | Status: AC
  Administered 2012-07-07: 21:00:00
  Filled 2012-07-07: qty 1

## 2012-07-07 MED ORDER — FAMOTIDINE-CA CARB-MAG HYDROX 10-800-165 MG PO CHEW: 1.0000 | CHEWABLE_TABLET | Freq: Every day | ORAL | Status: DC | PRN

## 2012-07-07 MED ORDER — ONDANSETRON HCL 4 MG/2ML IJ SOLN
INTRAMUSCULAR | Status: DC | PRN
  Administered 2012-07-07: 4 mg via INTRAVENOUS

## 2012-07-07 MED ORDER — CEFAZOLIN SODIUM-DEXTROSE 2-3 GM-% IV SOLR
2.0000 g | Freq: Four times a day (QID) | INTRAVENOUS | Status: AC
  Administered 2012-07-07 – 2012-07-08 (×2): 2 g via INTRAVENOUS
  Filled 2012-07-07 (×3): qty 50

## 2012-07-07 MED ORDER — SORBITOL 70 % SOLN
30.0000 mL | Freq: Every day | Status: DC | PRN
  Filled 2012-07-07: qty 30

## 2012-07-07 MED ORDER — 0.9 % SODIUM CHLORIDE (POUR BTL) OPTIME
TOPICAL | Status: DC | PRN
  Administered 2012-07-07 (×2): 1000 mL

## 2012-07-07 MED ORDER — WARFARIN - PHARMACIST DOSING INPATIENT
Freq: Every day | Status: DC
  Administered 2012-07-07 – 2012-07-10 (×2)
  Administered 2012-07-11: 7.5

## 2012-07-07 MED ORDER — BUPIVACAINE HCL (PF) 0.25 % IJ SOLN
INTRAMUSCULAR | Status: AC
  Filled 2012-07-07: qty 30

## 2012-07-07 MED ORDER — ROCURONIUM BROMIDE 100 MG/10ML IV SOLN
INTRAVENOUS | Status: DC | PRN
  Administered 2012-07-07: 50 mg via INTRAVENOUS

## 2012-07-07 MED ORDER — ALUM & MAG HYDROXIDE-SIMETH 200-200-20 MG/5ML PO SUSP: 30.0000 mL | ORAL | Status: DC | PRN

## 2012-07-07 MED ORDER — DOCUSATE SODIUM 100 MG PO CAPS
100.0000 mg | ORAL_CAPSULE | Freq: Two times a day (BID) | ORAL | Status: DC
  Administered 2012-07-07 – 2012-07-11 (×8): 100 mg via ORAL
  Filled 2012-07-07 (×7): qty 1

## 2012-07-07 MED ORDER — ENOXAPARIN SODIUM 40 MG/0.4ML ~~LOC~~ SOLN
40.0000 mg | SUBCUTANEOUS | Status: DC
  Administered 2012-07-08 – 2012-07-11 (×4): 40 mg via SUBCUTANEOUS
  Filled 2012-07-07 (×6): qty 0.4

## 2012-07-07 MED ORDER — DIPHENHYDRAMINE HCL 12.5 MG/5ML PO ELIX: 12.5000 mg | ORAL_SOLUTION | ORAL | Status: DC | PRN

## 2012-07-07 MED ORDER — POLYETHYLENE GLYCOL 3350 17 G PO PACK
17.0000 g | PACK | Freq: Every day | ORAL | Status: DC | PRN
  Administered 2012-07-11: 17 g via ORAL
  Filled 2012-07-07 (×2): qty 1

## 2012-07-07 MED ORDER — HYDROMORPHONE HCL PF 1 MG/ML IJ SOLN
INTRAMUSCULAR | Status: AC
  Filled 2012-07-07: qty 1

## 2012-07-07 MED ORDER — POTASSIUM CHLORIDE IN NACL 20-0.45 MEQ/L-% IV SOLN
INTRAVENOUS | Status: DC
  Administered 2012-07-07: 18:00:00 via INTRAVENOUS
  Administered 2012-07-08: 1000 mL via INTRAVENOUS
  Administered 2012-07-09 – 2012-07-11 (×2): via INTRAVENOUS
  Filled 2012-07-07 (×10): qty 1000

## 2012-07-07 MED ORDER — SENNA 8.6 MG PO TABS
1.0000 | ORAL_TABLET | Freq: Two times a day (BID) | ORAL | Status: DC
  Administered 2012-07-07 – 2012-07-11 (×8): 8.6 mg via ORAL
  Filled 2012-07-07 (×11): qty 1

## 2012-07-07 MED ORDER — METHOCARBAMOL 500 MG PO TABS
500.0000 mg | ORAL_TABLET | Freq: Four times a day (QID) | ORAL | Status: DC | PRN
  Administered 2012-07-08 – 2012-07-11 (×11): 500 mg via ORAL
  Filled 2012-07-07 (×11): qty 1

## 2012-07-07 MED ORDER — ONDANSETRON HCL 4 MG/2ML IJ SOLN: 4.0000 mg | Freq: Four times a day (QID) | INTRAMUSCULAR | Status: DC | PRN

## 2012-07-07 MED ORDER — METOCLOPRAMIDE HCL 5 MG/ML IJ SOLN
5.0000 mg | Freq: Three times a day (TID) | INTRAMUSCULAR | Status: DC | PRN
  Filled 2012-07-07: qty 2

## 2012-07-07 MED ORDER — PROPOFOL 10 MG/ML IV BOLUS
INTRAVENOUS | Status: DC | PRN
  Administered 2012-07-07: 120 mg via INTRAVENOUS

## 2012-07-07 MED ORDER — ARTIFICIAL TEARS OP OINT
TOPICAL_OINTMENT | OPHTHALMIC | Status: DC | PRN
  Administered 2012-07-07: 1 via OPHTHALMIC

## 2012-07-07 MED ORDER — ALBUMIN HUMAN 5 % IV SOLN
INTRAVENOUS | Status: DC | PRN
  Administered 2012-07-07: 12:00:00 via INTRAVENOUS

## 2012-07-07 MED ORDER — ACETAMINOPHEN 10 MG/ML IV SOLN
1000.0000 mg | Freq: Four times a day (QID) | INTRAVENOUS | Status: AC
  Administered 2012-07-08 (×3): 1000 mg via INTRAVENOUS
  Filled 2012-07-07 (×3): qty 100

## 2012-07-07 MED ORDER — HYDROMORPHONE HCL PF 1 MG/ML IJ SOLN
0.2500 mg | INTRAMUSCULAR | Status: DC | PRN
  Administered 2012-07-07 (×4): 0.25 mg via INTRAVENOUS

## 2012-07-07 MED ORDER — ACETAMINOPHEN 325 MG PO TABS: 650.0000 mg | ORAL_TABLET | Freq: Four times a day (QID) | ORAL | Status: DC | PRN

## 2012-07-07 MED ORDER — MIDAZOLAM HCL 5 MG/5ML IJ SOLN
INTRAMUSCULAR | Status: DC | PRN
  Administered 2012-07-07: 2 mg via INTRAVENOUS

## 2012-07-07 MED ORDER — ACETAMINOPHEN 650 MG RE SUPP: 650.0000 mg | Freq: Four times a day (QID) | RECTAL | Status: DC | PRN

## 2012-07-07 MED ORDER — METHOCARBAMOL 100 MG/ML IJ SOLN
500.0000 mg | Freq: Four times a day (QID) | INTRAVENOUS | Status: DC | PRN
  Filled 2012-07-07: qty 5

## 2012-07-07 MED ORDER — FENTANYL CITRATE 0.05 MG/ML IJ SOLN
INTRAMUSCULAR | Status: DC | PRN
  Administered 2012-07-07: 100 ug via INTRAVENOUS
  Administered 2012-07-07 (×8): 50 ug via INTRAVENOUS

## 2012-07-07 MED ORDER — MENTHOL 3 MG MT LOZG: 1.0000 | LOZENGE | OROMUCOSAL | Status: DC | PRN

## 2012-07-07 MED ORDER — BUPIVACAINE HCL (PF) 0.25 % IJ SOLN
INTRAMUSCULAR | Status: DC | PRN
  Administered 2012-07-07: 10 mL

## 2012-07-07 MED ORDER — LACTATED RINGERS IV SOLN
INTRAVENOUS | Status: DC | PRN
  Administered 2012-07-07 (×3): via INTRAVENOUS

## 2012-07-07 MED ORDER — METOCLOPRAMIDE HCL 5 MG PO TABS
5.0000 mg | ORAL_TABLET | Freq: Three times a day (TID) | ORAL | Status: DC | PRN
  Filled 2012-07-07: qty 2

## 2012-07-07 MED ORDER — ONDANSETRON HCL 4 MG PO TABS: 4.0000 mg | ORAL_TABLET | Freq: Four times a day (QID) | ORAL | Status: DC | PRN

## 2012-07-07 MED ORDER — CALCIUM CARBONATE ANTACID 500 MG PO CHEW
1.0000 | CHEWABLE_TABLET | Freq: Every day | ORAL | Status: DC | PRN
  Filled 2012-07-07: qty 1

## 2012-07-07 MED ORDER — LIDOCAINE HCL (CARDIAC) 20 MG/ML IV SOLN
INTRAVENOUS | Status: DC | PRN
  Administered 2012-07-07: 80 mg via INTRAVENOUS

## 2012-07-07 SURGICAL SUPPLY — 106 items
BANDAGE ELASTIC 3 VELCRO ST LF (GAUZE/BANDAGES/DRESSINGS) ×3 IMPLANT
BANDAGE ELASTIC 4 VELCRO ST LF (GAUZE/BANDAGES/DRESSINGS) IMPLANT
BENZOIN TINCTURE PRP APPL 2/3 (GAUZE/BANDAGES/DRESSINGS) ×3 IMPLANT
BIT DRILL 2 FAST STEP (BIT) ×3 IMPLANT
BIT DRILL 2.5X4 QC (BIT) ×3 IMPLANT
BLADE SAW SAG 73X25 THK (BLADE) ×1
BLADE SAW SGTL 73X25 THK (BLADE) ×2 IMPLANT
BLADE SURG 10 STRL SS (BLADE) ×3 IMPLANT
BLADE SURG ROTATE 9660 (MISCELLANEOUS) IMPLANT
BNDG COHESIVE 4X5 TAN STRL (GAUZE/BANDAGES/DRESSINGS) ×3 IMPLANT
BNDG ESMARK 4X9 LF (GAUZE/BANDAGES/DRESSINGS) ×3 IMPLANT
BRUSH FEMORAL CANAL (MISCELLANEOUS) IMPLANT
CLOTH BEACON ORANGE TIMEOUT ST (SAFETY) ×3 IMPLANT
CORDS BIPOLAR (ELECTRODE) ×3 IMPLANT
COVER BACK TABLE 24X17X13 BIG (DRAPES) IMPLANT
COVER SURGICAL LIGHT HANDLE (MISCELLANEOUS) ×6 IMPLANT
CUFF TOURNIQUET SINGLE 18IN (TOURNIQUET CUFF) ×3 IMPLANT
CUFF TOURNIQUET SINGLE 24IN (TOURNIQUET CUFF) IMPLANT
DECANTER SPIKE VIAL GLASS SM (MISCELLANEOUS) IMPLANT
DRAPE INCISE IOBAN 66X45 STRL (DRAPES) ×6 IMPLANT
DRAPE OEC MINIVIEW 54X84 (DRAPES) ×3 IMPLANT
DRAPE ORTHO SPLIT 77X108 STRL (DRAPES) ×2
DRAPE PROXIMA HALF (DRAPES) ×3 IMPLANT
DRAPE SURG ORHT 6 SPLT 77X108 (DRAPES) ×4 IMPLANT
DRAPE U-SHAPE 47X51 STRL (DRAPES) ×3 IMPLANT
DRILL BIT 5/64 (BIT) ×3 IMPLANT
DRSG MEPILEX BORDER 4X12 (GAUZE/BANDAGES/DRESSINGS) IMPLANT
DRSG MEPILEX BORDER 4X8 (GAUZE/BANDAGES/DRESSINGS) ×3 IMPLANT
DRSG PAD ABDOMINAL 8X10 ST (GAUZE/BANDAGES/DRESSINGS) IMPLANT
DURAPREP 26ML APPLICATOR (WOUND CARE) ×3 IMPLANT
ELECT CAUTERY BLADE 6.4 (BLADE) ×3 IMPLANT
ELECT REM PT RETURN 9FT ADLT (ELECTROSURGICAL) ×3
ELECTRODE REM PT RTRN 9FT ADLT (ELECTROSURGICAL) ×2 IMPLANT
EVACUATOR 1/8 PVC DRAIN (DRAIN) IMPLANT
GAUZE XEROFORM 1X8 LF (GAUZE/BANDAGES/DRESSINGS) ×3 IMPLANT
GLOVE BIO SURGEON STRL SZ7.5 (GLOVE) ×3 IMPLANT
GLOVE BIOGEL PI IND STRL 7.0 (GLOVE) ×4 IMPLANT
GLOVE BIOGEL PI INDICATOR 7.0 (GLOVE) ×2
GLOVE BIOGEL PI ORTHO PRO SZ8 (GLOVE) ×1
GLOVE ORTHO TXT STRL SZ7.5 (GLOVE) ×6 IMPLANT
GLOVE PI ORTHO PRO STRL SZ8 (GLOVE) ×2 IMPLANT
GLOVE SS BIOGEL STRL SZ 8 (GLOVE) ×2 IMPLANT
GLOVE SUPERSENSE BIOGEL SZ 8 (GLOVE) ×1
GLOVE SURG ORTHO 8.0 STRL STRW (GLOVE) ×6 IMPLANT
GLOVE SURG SS PI 7.0 STRL IVOR (GLOVE) ×3 IMPLANT
GOWN STRL NON-REIN LRG LVL3 (GOWN DISPOSABLE) ×12 IMPLANT
HANDPIECE INTERPULSE COAX TIP (DISPOSABLE)
HOOD PEEL AWAY FACE SHEILD DIS (HOOD) ×12 IMPLANT
K-WIRE 1.6 (WIRE) ×1
K-WIRE FX5X1.6XNS BN SS (WIRE) ×2
KIT BASIN OR (CUSTOM PROCEDURE TRAY) ×3 IMPLANT
KIT ROOM TURNOVER OR (KITS) ×3 IMPLANT
KWIRE FX5X1.6XNS BN SS (WIRE) ×2 IMPLANT
LOOP VESSEL MAXI BLUE (MISCELLANEOUS) IMPLANT
MANIFOLD NEPTUNE II (INSTRUMENTS) ×3 IMPLANT
NEEDLE 22X1 1/2 (OR ONLY) (NEEDLE) ×3 IMPLANT
NEEDLE BLUNT 16X1.5 OR ONLY (NEEDLE) IMPLANT
NEEDLE HYPO 25GX1X1/2 BEV (NEEDLE) IMPLANT
NS IRRIG 1000ML POUR BTL (IV SOLUTION) ×6 IMPLANT
PACK ORTHO EXTREMITY (CUSTOM PROCEDURE TRAY) ×3 IMPLANT
PACK TOTAL JOINT (CUSTOM PROCEDURE TRAY) ×3 IMPLANT
PAD ARMBOARD 7.5X6 YLW CONV (MISCELLANEOUS) ×6 IMPLANT
PAD CAST 3X4 CTTN HI CHSV (CAST SUPPLIES) ×2 IMPLANT
PAD CAST 4YDX4 CTTN HI CHSV (CAST SUPPLIES) ×2 IMPLANT
PADDING CAST ABS 3INX4YD NS (CAST SUPPLIES) ×1
PADDING CAST ABS COTTON 3X4 (CAST SUPPLIES) ×2 IMPLANT
PADDING CAST COTTON 3X4 STRL (CAST SUPPLIES) ×1
PADDING CAST COTTON 4X4 STRL (CAST SUPPLIES) ×1
PEG SUBCHONDRAL SMOOTH 2.0X18 (Peg) ×9 IMPLANT
PEG SUBCHONDRAL SMOOTH 2.0X20 (Peg) ×12 IMPLANT
PILLOW ABDUCTION HIP (SOFTGOODS) IMPLANT
PLATE SHORT 21.6X48.9 NRRW LT (Plate) ×3 IMPLANT
PRESSURIZER FEMORAL UNIV (MISCELLANEOUS) IMPLANT
RETRIEVER SUT HEWSON (MISCELLANEOUS) ×3 IMPLANT
SCREW BN 12X3.5XNS CORT TI (Screw) ×2 IMPLANT
SCREW CORT 3.5X12 (Screw) ×1 IMPLANT
SCREW CORT 3.5X14 LNG (Screw) ×3 IMPLANT
SET HNDPC FAN SPRY TIP SCT (DISPOSABLE) IMPLANT
SPLINT FIBERGLASS 4X15 (CAST SUPPLIES) ×3 IMPLANT
SPONGE GAUZE 4X4 12PLY (GAUZE/BANDAGES/DRESSINGS) ×3 IMPLANT
SPONGE LAP 4X18 X RAY DECT (DISPOSABLE) IMPLANT
STAPLER VISISTAT 35W (STAPLE) IMPLANT
STRIP CLOSURE SKIN 1/2X4 (GAUZE/BANDAGES/DRESSINGS) ×3 IMPLANT
SUCTION FRAZIER TIP 10 FR DISP (SUCTIONS) ×6 IMPLANT
SUT ETHILON 4 0 PS 2 18 (SUTURE) ×3 IMPLANT
SUT FIBERWIRE #2 38 REV NDL BL (SUTURE) ×9
SUT FIBERWIRE #2 38 T-5 BLUE (SUTURE)
SUT MNCRL AB 4-0 PS2 18 (SUTURE) ×6 IMPLANT
SUT PROLENE 4 0 PS 2 18 (SUTURE) IMPLANT
SUT VIC AB 0 CT1 27 (SUTURE) ×1
SUT VIC AB 0 CT1 27XBRD ANBCTR (SUTURE) ×2 IMPLANT
SUT VIC AB 0 CTB1 27 (SUTURE) ×3 IMPLANT
SUT VIC AB 2-0 CT1 27 (SUTURE) ×1
SUT VIC AB 2-0 CT1 TAPERPNT 27 (SUTURE) ×2 IMPLANT
SUT VIC AB 3-0 FS2 27 (SUTURE) ×3 IMPLANT
SUT VIC AB 3-0 SH 18 (SUTURE) ×3 IMPLANT
SUTURE FIBERWR #2 38 T-5 BLUE (SUTURE) IMPLANT
SUTURE FIBERWR#2 38 REV NDL BL (SUTURE) ×6 IMPLANT
SYR CONTROL 10ML LL (SYRINGE) ×6 IMPLANT
TOWEL OR 17X24 6PK STRL BLUE (TOWEL DISPOSABLE) ×3 IMPLANT
TOWEL OR 17X26 10 PK STRL BLUE (TOWEL DISPOSABLE) ×3 IMPLANT
TOWER CARTRIDGE SMART MIX (DISPOSABLE) IMPLANT
TRAY FOLEY CATH 14FR (SET/KITS/TRAYS/PACK) ×3 IMPLANT
TUBE CONNECTING 12X1/4 (SUCTIONS) ×3 IMPLANT
WATER STERILE IRR 1000ML POUR (IV SOLUTION) ×3 IMPLANT
YANKAUER SUCT BULB TIP NO VENT (SUCTIONS) IMPLANT

## 2012-07-07 NOTE — Progress Notes (Signed)
Discussed pain mgmt with Dr. Gypsy Balsam.  He stated he wanted to continue to use Dilaudid for pain.  Will use small doses of dilaudid.

## 2012-07-07 NOTE — Progress Notes (Signed)
The patient has been re-examined, and the chart reviewed, and there have been no interval changes to the documented history and physical.    The risks benefits and alternatives were discussed with the patient including but not limited to the risks of nonoperative treatment, versus surgical intervention including infection, bleeding, nerve injury, periprosthetic fracture, the need for revision surgery, dislocation, leg length discrepancy, blood clots, cardiopulmonary complications, morbidity, mortality, among others, and they were willing to proceed.    L hip THA, L wrist ORIF.

## 2012-07-07 NOTE — Preoperative (Signed)
Beta Blockers   Reason not to administer Beta Blockers:Not Applicable 

## 2012-07-07 NOTE — Anesthesia Preprocedure Evaluation (Signed)
Anesthesia Evaluation  Patient identified by MRN, date of birth, ID band Patient awake    Reviewed: Allergy & Precautions, H&P , NPO status , Patient's Chart, lab work & pertinent test results  Airway Mallampati: I TM Distance: >3 FB Neck ROM: Full    Dental   Pulmonary  breath sounds clear to auscultation        Cardiovascular Rhythm:Regular Rate:Normal     Neuro/Psych    GI/Hepatic   Endo/Other    Renal/GU      Musculoskeletal   Abdominal   Peds  Hematology   Anesthesia Other Findings   Reproductive/Obstetrics                           Anesthesia Physical Anesthesia Plan  ASA: II  Anesthesia Plan: General   Post-op Pain Management:    Induction: Intravenous  Airway Management Planned: Oral ETT  Additional Equipment:   Intra-op Plan:   Post-operative Plan: Extubation in OR  Informed Consent: I have reviewed the patients History and Physical, chart, labs and discussed the procedure including the risks, benefits and alternatives for the proposed anesthesia with the patient or authorized representative who has indicated his/her understanding and acceptance.     Plan Discussed with: CRNA and Surgeon  Anesthesia Plan Comments:         Anesthesia Quick Evaluation

## 2012-07-07 NOTE — Op Note (Signed)
07/05/2012 - 07/07/2012  12:54 PM  PATIENT:  Ruth Ford   MRN: [id number]  PRE-OPERATIVE DIAGNOSIS:  Left distal radius fracture and left hip osteoarthritis  POST-OPERATIVE DIAGNOSIS:  Left distal radius fracture and left hip osteoarthritis  PROCEDURE:  Procedure(s): TOTAL HIP ARTHROPLASTY OPEN REDUCTION INTERNAL FIXATION (ORIF) DISTAL RADIAL FRACTURE, greater than 3 pieces   PREOPERATIVE INDICATIONS:    Ruth Ford is an 79 y.o. female who has a diagnosis of osteoarthritis, and also had a recent fall with a distal radius fracture and elected for surgical management after failing conservative treatment.  The fall was essentially caused by her arthritis, and she has had progressive decline in function, and elected to treat both her osteoarthritis as well as her wrist fracture under one anesthetic.  The risks benefits and alternatives were discussed with the patient including but not limited to the risks of nonoperative treatment, versus surgical intervention including infection, bleeding, nerve injury, periprosthetic fracture, the need for revision surgery, dislocation, leg length discrepancy, blood clots, cardiopulmonary complications, morbidity, mortality, tendon rupture, hardware prominence, hardware failure, nonunion, malunion, post-traumatic arthritis, regional pain syndro  OPERATIVE FINDINGS: Comminution of the distal radius fracture.  End stage grade 4 changes of the left hip.     OPERATIVE REPORT     SURGEON:  Teryl Lucy, MD    ASSISTANT:  Janace Litten, OPA-C  (Present throughout the entire procedure,  necessary for completion of procedure in a timely manner, assisting with retraction, instrumentation, and closure)     ANESTHESIA:  General    COMPLICATIONS:  None.     COMPONENTS:  [employer] fit femur size 5 with a 36 mm +5 head ball and a pinnacle gription acetabular shell size 52 with a 10 degree lipped polyethylene liner.   [employer] DVR volar plate with 2 proximal  cortical screws and multiple distal interlocking smooth pegs, using the short narrow plate.     PROCEDURE IN DETAIL:   The patient was met in the holding area and  identified.  The appropriate hip was identified and marked at the operative site.  The patient was then transported to the OR  and  placed under general anesthesia.  At that point, the patient was  placed in the lateral decubitus position with the operative side up and  secured to the operating room table and all bony prominences padded.     The operative lower extremity was prepped from the iliac crest to the distal leg.  Sterile draping was performed.  Time out was performed prior to incision.      A routine posterolateral approach was utilized via sharp dissection  carried down to the subcutaneous tissue.  Gross bleeders were Bovie coagulated.  The iliotibial band was identified and incised along the length of the skin incision.  Self-retaining retractors were  inserted.  With the hip internally rotated, the short external rotators  were identified. The piriformis and capsule was tagged with FiberWire, and the hip capsule released in a T-type fashion.  The femoral neck was exposed, and I resected the femoral neck using the appropriate jig. This was performed at approximately a thumb's breadth above the lesser trochanter.    I then exposed the deep acetabulum, cleared out any tissue including the ligamentum teres.  A wing retractor was placed.  After adequate visualization, I excised the labrum, and then sequentially reamed.  I actually had to go back and make a second cut on the femoral neck, because my access to the  acetabulum was limited, because my first cut was slightly high. I placed the trial acetabulum, which seated nicely, and then impacted the real cup into place.  Appropriate version and inclination was confirmed clinically matching their bony anatomy, and also with the use of the jig.  A trial polyethylene liner was placed and  the wing retractor removed.    I then prepared the proximal femur using the cookie-cutter, the lateralizing reamer, and then sequentially reamed and broached.  A trial broach, neck, and head was utilized, and I reduced the hip and it was found to have excellent stability with functional range of motion. The trial components were then removed, and the real polyethylene liner was placed with the lip directed posteriorly.  I then impacted the real femoral prosthesis into place into the appropriate version, slightly anteverted to the normal anatomy, and I impacted the real head ball into place. The hip was then reduced and taken through functional range of motion and found to have excellent stability. Leg lengths were restored.  I then used a 2 mm drill bits to pass the FiberWire suture from the capsule and puriform is through the greater trochanter, and secured this. Excellent posterior capsular repair was achieved. I also closed the T in the capsule.  I then irrigated the hip copiously again with pulse lavage, and repaired the fascia with Vicryl, followed by Vicryl for the subcutaneous tissue, Monocryl for the skin, Steri-Strips and sterile gauze. The wounds were injected. The patient was then awakened and returned to PACU in stable and satisfactory condition. There were no complications.  The patient was then turned supine, and we began the second portion of the operation.  OPERATIVE PROCEDURE: Time out was performed. The upper extremity was prepped and draped in usual sterile fashion. The arm was elevated and exsanguinated and the tourniquet was inflated at hg.    Volar approach to the distal radius was carried out, and the flexor carpi radialis was retracted radially. The radial artery was protected throughout the case.  Deep dissection was carried down, and the pronator quadratus was elevated off of the radius. The fracture site was identified and cleaned and reduced anatomically. This keyed  into place nicely.   I held this provisionally with a K wire, and C-arm used to confirm alignment.   I had restored height and inclination and then applied a volar plate. A K wire was used to confirm appropriate position of the plate, and once I was satisfied with the overall alignment I was able to secure the plate proximally with a cortical screw.   I then secured the fracture with multiple smooth interlocking pegs distally, and confirmed that none of these were in the joint, and none of these were penetrating the dorsal cortex. I also secured the plate proximally with one more cortical screw.   The wounds were irrigated copiously, and I inspected the pronator quadratus, but it was not amenable to repair. The skin was closed with nylon suture, and sterile gauze was applied, along with a volar splint. The tourniquet was released. Total tourniquet time was approximately 70 minutes. She was awakened and returned back in stable and satisfactory condition. There were no complications and She tolerated the procedure well.   Teryl Lucy, MD Orthopedic Surgeon [phone]   07/07/2012 12:54 PM

## 2012-07-07 NOTE — Anesthesia Postprocedure Evaluation (Signed)
  Anesthesia Post-op Note  Patient: Ruth Ford  Procedure(s) Performed: Procedure(s): OPEN REDUCTION INTERNAL FIXATION (ORIF) DISTAL RADIAL FRACTURE (Left) TOTAL HIP ARTHROPLASTY (Left)  Patient Location: PACU  Anesthesia Type:General  Level of Consciousness: awake  Airway and Oxygen Therapy: Patient Spontanous Breathing  Post-op Pain: mild  Post-op Assessment: Post-op Vital signs reviewed, Patient's Cardiovascular Status Stable, Respiratory Function Stable, Patent Airway, No signs of Nausea or vomiting and Pain level controlled  Post-op Vital Signs: stable  Complications: No apparent anesthesia complications

## 2012-07-07 NOTE — Progress Notes (Addendum)
ANTICOAGULATION CONSULT NOTE - Initial Consult  Pharmacy Consult for Coumadin Indication: VTE prophylaxis  No Known Allergies  Patient Measurements: Height: 5\' 5"  (165.1 cm) Weight: 183 lb 10.3 oz (83.3 kg) IBW/kg (Calculated) : 57  Vital Signs: Temp: 97.6 F (36.4 C) (03/06 1721) Temp src: Oral (03/06 1721) BP: 116/59 mmHg (03/06 1721) Pulse Rate: 92 (03/06 1721)  Labs:  Recent Labs  07/05/12 2022  HGB 15.1*  HCT 43.6  PLT 230  CREATININE 0.98    Estimated Creatinine Clearance: 52 ml/min (by C-G formula based on Cr of 0.98).   Medical History: Past Medical History  Diagnosis Date  . Arthritis     Medications:  Prescriptions prior to admission  Medication Sig Dispense Refill  . famotidine-calcium carbonate-magnesium hydroxide (PEPCID COMPLETE) 10-800-165 MG CHEW Chew 1 tablet by mouth daily as needed (indigestion).       . fish oil-omega-3 fatty acids 1000 MG capsule Take 1 g by mouth daily.       . hydrochlorothiazide (HYDRODIURIL) 25 MG tablet Take 25 mg by mouth daily.      Marland Kitchen HYDROcodone-acetaminophen (NORCO/VICODIN) 5-325 MG per tablet Take 1 tablet by mouth every 6 (six) hours as needed for pain.        Assessment: 79 yo F s/p wrist fracture ~ 2 weeks ago and longstanding history of OA to her L hip.  Pt elected to have surgical repair of both at the same time.  Pt now s/p ORIF of L distal radial fracture and THA of L hip.  To start Coumadin for post-op VTE prophylaxis.  Baseline INR was not done pre-op, have ordered STAT.  Pt received Lovenox SQ x 1 dose 3/5 prior to surgery per MD notes.  Goal of Therapy:  INR 2-3   Plan:  Follow-up baseline INR.  If INR <1.5 will order Coumadin 5 mg PO x 1 tonight. Daily INR already ordered by MD. Coumadin education materials ordered.  [employer], Pharm.D., BCPS Clinical Pharmacist Pager [phone] 07/07/2012 5:42 PM   Baseline INR 1.06.  Continue with Coumadin 5 mg dose as ordered.  [employer],  Pharm.D., BCPS Clinical Pharmacist Pager [phone] 07/07/2012 7:14 PM

## 2012-07-07 NOTE — Transfer of Care (Signed)
Immediate Anesthesia Transfer of Care Note  Patient: Ruth Ford  Procedure(s) Performed: Procedure(s): OPEN REDUCTION INTERNAL FIXATION (ORIF) DISTAL RADIAL FRACTURE (Left) TOTAL HIP ARTHROPLASTY (Left)  Patient Location: PACU  Anesthesia Type:General  Level of Consciousness: awake, alert  and oriented  Airway & Oxygen Therapy: Patient Spontanous Breathing and Patient connected to face mask oxygen  Post-op Assessment: Report given to PACU RN  Post vital signs: Reviewed and stable  Complications: No apparent anesthesia complications

## 2012-07-08 ENCOUNTER — Encounter (HOSPITAL_COMMUNITY): Payer: Self-pay | Admitting: Orthopedic Surgery

## 2012-07-08 DIAGNOSIS — M1710 Unilateral primary osteoarthritis, unspecified knee: Secondary | ICD-10-CM

## 2012-07-08 DIAGNOSIS — S52502A Unspecified fracture of the lower end of left radius, initial encounter for closed fracture: Secondary | ICD-10-CM

## 2012-07-08 DIAGNOSIS — M1612 Unilateral primary osteoarthritis, left hip: Secondary | ICD-10-CM | POA: Diagnosis present

## 2012-07-08 HISTORY — DX: Unspecified fracture of the lower end of left radius, initial encounter for closed fracture: S52.502A

## 2012-07-08 HISTORY — DX: Unilateral primary osteoarthritis, unspecified knee: M17.10

## 2012-07-08 LAB — CBC
Hemoglobin: 11.4 g/dL — ABNORMAL LOW (ref 12.0–15.0)
MCH: 30.3 pg (ref 26.0–34.0)
MCV: 90.2 fL (ref 78.0–100.0)
RBC: 3.76 MIL/uL — ABNORMAL LOW (ref 3.87–5.11)
WBC: 8.4 10*3/uL (ref 4.0–10.5)

## 2012-07-08 LAB — BASIC METABOLIC PANEL
CO2: 27 mEq/L (ref 19–32)
Calcium: 8.7 mg/dL (ref 8.4–10.5)
Chloride: 100 mEq/L (ref 96–112)
Creatinine, Ser: 0.95 mg/dL (ref 0.50–1.10)
Glucose, Bld: 124 mg/dL — ABNORMAL HIGH (ref 70–99)

## 2012-07-08 MED ORDER — OXYCODONE-ACETAMINOPHEN 5-325 MG PO TABS
1.0000 | ORAL_TABLET | Freq: Four times a day (QID) | ORAL | Status: DC | PRN
  Administered 2012-07-08 – 2012-07-11 (×11): 2 via ORAL
  Filled 2012-07-08 (×11): qty 2

## 2012-07-08 MED ORDER — WARFARIN SODIUM 5 MG PO TABS
5.0000 mg | ORAL_TABLET | Freq: Once | ORAL | Status: AC
  Administered 2012-07-08: 5 mg via ORAL
  Filled 2012-07-08: qty 1

## 2012-07-08 MED ORDER — OXYCODONE HCL 5 MG PO TABS
5.0000 mg | ORAL_TABLET | Freq: Four times a day (QID) | ORAL | Status: DC | PRN
  Filled 2012-07-08: qty 2

## 2012-07-08 NOTE — Clinical Social Work Note (Signed)
Clinical Social Work   CSW received consult for SNF. CSW reviewed chart and discussed pt with RN during progression. PT is recommending SNF. Full assessment to follow. Please call with any urgent needs. CSW will continue to follow.   Dede Query, MSW, Theresia Majors [phone]

## 2012-07-08 NOTE — Evaluation (Signed)
Physical Therapy   RE-Evaluation Patient Details Name: Ruth Ford MRN: [id number] DOB: 1933/09/08 Today's Date: 07/08/2012 Time: 7829-5621 PT Time Calculation (min): 38 min  PT Assessment / Plan / Recommendation Clinical Impression  Pt. was admitted following a fall resulting in left wrist fx, possible non displaced coxxyx fx, bilateral hip OA.  It is believed that pt's fall was in part due to loss of balance from her OA and she was scheduled for THA in 2 weeks.  On 3/6 she underwent ORIF left wrist and posterior L THA.  She presents to PT with a decrease in her functional mobility and gait, pain  and the below deficits.  She needs aacute PT to address these issues.  She will need SNF level rehab priot to Dc home.    PT Assessment  Patient needs continued PT services    Follow Up Recommendations  SNF;Supervision/Assistance - 24 hour;Supervision for mobility/OOB    Does the patient have the potential to tolerate intense rehabilitation      Barriers to Discharge Decreased caregiver support      Equipment Recommendations  Rolling walker with 5" wheels    Recommendations for Other Services     Frequency Min 6X/week    Precautions / Restrictions Precautions Precautions: Fall;Posterior Hip Precaution Booklet Issued: Yes (comment) Precaution Comments: Educated pt and Scientist, research (medical) on posterior THR precautions and posted info on dry erase board.  Also reviewed WBAT L LE and NWB left UE except thru platform attachment on RW. Required Braces or Orthoses: Other Brace/Splint Other Brace/Splint: hip abduction pillow Restrictions Weight Bearing Restrictions: Yes LUE Weight Bearing: Non weight bearing (NWB through wrist, use PFRW ro upright assist) LLE Weight Bearing: Weight bearing as tolerated Other Position/Activity Restrictions: pt in L UE splint   Pertinent Vitals/Pain See vitals tab      Mobility  Bed Mobility Bed Mobility: Supine to Sit Supine to Sit: 1: +2 Total assist;HOB  flat Supine to Sit: Patient Percentage: 50% Sitting - Scoot to Edge of Bed: 2: Max assist Details for Bed Mobility Assistance: assist for trunk elevation and moving L LE, cues for hip precautions Transfers Transfers: Sit to Stand;Stand to Sit;Stand Pivot Transfers Sit to Stand: 1: +2 Total assist;With upper extremity assist;From bed Sit to Stand: Patient Percentage: 50% Stand to Sit: 1: +2 Total assist;To chair/3-in-1 Stand to Sit: Patient Percentage: 50% Stand Pivot Transfers: 1: +2 Total assist Stand Pivot Transfers: Patient Percentage: 50% Details for Transfer Assistance: assist needed to rise and to control descent as well as to pivot; vc's for maintaining hip precautions Ambulation/Gait Ambulation/Gait Assistance: Not tested (comment) Assistive device: None Wheelchair Mobility Wheelchair Mobility: No    Exercises Total Joint Exercises Ankle Circles/Pumps: AROM;Both;15 reps;Supine   PT Diagnosis: Difficulty walking;Acute pain  PT Problem List: Decreased activity tolerance;Decreased balance;Decreased mobility;Decreased knowledge of use of DME;Decreased knowledge of precautions;Pain PT Treatment Interventions: DME instruction;Gait training;Functional mobility training;Therapeutic activities;Therapeutic exercise;Balance training;Patient/family education   PT Goals Acute Rehab PT Goals PT Goal Formulation: With patient Time For Goal Achievement: 07/15/12 Potential to Achieve Goals: Good Pt will go Supine/Side to Sit: with min assist PT Goal: Supine/Side to Sit - Progress: Goal set today Pt will go Sit to Supine/Side: with min assist PT Goal: Sit to Supine/Side - Progress: Goal set today Pt will go Sit to Stand: with min assist PT Goal: Sit to Stand - Progress: Goal set today Pt will go Stand to Sit: with min assist PT Goal: Stand to Sit - Progress: Goal set  today Pt will Transfer Bed to Chair/Chair to Bed: with min assist PT Transfer Goal: Bed to Chair/Chair to Bed - Progress:  Goal set today Pt will Ambulate: 16 - 50 feet;with min assist;with rolling walker;Other (comment) (left platform attachment) PT Goal: Ambulate - Progress: Goal set today Pt will Perform Home Exercise Program: with supervision, verbal cues required/provided PT Goal: Perform Home Exercise Program - Progress: Goal set today Additional Goals Additional Goal #1: Pt. will state posterior hip precautions and comply 100% of time PT Goal: Additional Goal #1 - Progress: Goal set today  Visit Information  Last PT Received On: 07/08/12 Assistance Needed: +2    Subjective Data  Subjective: 'my hip hurts" Patient Stated Goal: "taking care of things in my house"   Prior Functioning  Home Living Lives With: Alone Available Help at Discharge: Family;Available 24 hours/day;Other (Comment) (pllans to go to niece's home after Dc from rehab) Type of Home: Other (Comment) (condo) Home Access: Stairs to enter Entrance Stairs-Number of Steps: 5 Entrance Stairs-Rails: None Home Layout: Two level Alternate Level Stairs-Number of Steps: 10 Alternate Level Stairs-Rails: Left;Right Bathroom Shower/Tub: Tub/shower unit;Curtain Bathroom Toilet: Standard Bathroom Accessibility: Yes How Accessible: Accessible via walker Home Adaptive Equipment: Dan Humphreys - standard Prior Function Level of Independence: Independent Able to Take Stairs?: Yes Driving: Yes Vocation: Retired Musician: No difficulties Dominant Hand: Right    Cognition  Cognition Overall Cognitive Status: Appears within functional limits for tasks assessed/performed Arousal/Alertness: Awake/alert Orientation Level: Oriented X4 / Intact Behavior During Session: [facility] for tasks performed    Extremity/Trunk Assessment Right Upper Extremity Assessment RUE ROM/Strength/Tone: WFL for tasks assessed RUE Sensation: WFL - Light Touch RUE Coordination: WFL - gross/fine motor Left Upper Extremity Assessment LUE ROM/Strength/Tone:  Deficits;Unable to fully assess;Due to precautions LUE ROM/Strength/Tone Deficits: no ROM in wrist due to splint/ORIF, otherwise WFL LUE Sensation: WFL - Light Touch Right Lower Extremity Assessment RLE ROM/Strength/Tone: WFL for tasks assessed RLE Sensation: WFL - Light Touch Left Lower Extremity Assessment LLE ROM/Strength/Tone: Deficits;Due to pain;Due to precautions (but WFL for tasks) LLE Sensation: WFL - Light Touch Trunk Assessment Trunk Assessment: Normal   Balance Balance Balance Assessed: Yes Static Sitting Balance Static Sitting - Balance Support: No upper extremity supported Static Sitting - Level of Assistance: 5: Stand by assistance  End of Session PT - End of Session Equipment Utilized During Treatment: Gait belt Activity Tolerance: Patient limited by pain Patient left: in chair;with call bell/phone within reach Nurse Communication: Mobility status;Weight bearing status;Precautions;Other (comment) (posted postterior hip precautions sheet on dry erase board)  GP     Ferman Hamming 07/08/2012, 9:14 AM Weldon Picking PT Acute Rehab Services [phone] Beeper [phone]

## 2012-07-08 NOTE — Progress Notes (Signed)
ANTICOAGULATION CONSULT NOTE - follow up Pharmacy Consult for Coumadin Indication: VTE prophylaxis  No Known Allergies  Patient Measurements: Height: 5\' 5"  (165.1 cm) Weight: 183 lb 10.3 oz (83.3 kg) IBW/kg (Calculated) : 57  Vital Signs: Temp: 98.8 F (37.1 C) (03/07 1004) Temp src: Oral (03/07 1004) BP: 81/57 mmHg (03/07 1004) Pulse Rate: 89 (03/07 1004)  Labs:  Recent Labs  07/05/12 2022 07/07/12 1757 07/08/12 0510  HGB 15.1* 12.7 11.4*  HCT 43.6 37.2 33.9*  PLT 230 194 194  LABPROT  --  13.7 14.7  INR  --  1.06 1.17  CREATININE 0.98 0.81 0.95    Estimated Creatinine Clearance: 53.7 ml/min (by C-G formula based on Cr of 0.95).   Assessment: 79 yo F s/p wrist fracture ~ 2 weeks ago and longstanding history of OA to her L hip.  Pt elected to have surgical repair of both at the same time.  Pt now s/p ORIF of L distal radial fracture and THA of L hip.  Coumadin for post-op VTE prophylaxis. INR 1.17 after 1 dose of coumadin 5 mg.  INR still baseline as expected.  H/H down to 11.4/33.9 2nd ABLA as expected.  No bleeding reported. Coumadin video and book charted as completed 3/6. Plan is SNF for short-term rehab.  Goal of Therapy:  INR 2-3   Plan:  1. Repeat coumadin 5 mg po x 1 dose today 2. LMWH 40 qday to be DC'd when INR > = 1.8 per MD orders 3. Daily INR Herby Abraham, Pharm.D. [phone] 07/08/2012 11:06 AM

## 2012-07-08 NOTE — Progress Notes (Signed)
Patient ID: Ruth Ford, female   DOB: [birth year], 79 y.o.   MRN: [id number]     Subjective:  Patient reports pain as moderate to severe.  She is having uncontrollable pain mainly in her hip.  Objective:   VITALS:   Filed Vitals:   07/08/12 0400 07/08/12 0603 07/08/12 1004 07/08/12 1446  BP:  113/52 81/57 103/60  Pulse:  91 89 93  Temp:  98.8 F (37.1 C) 98.8 F (37.1 C) 100.3 F (37.9 C)  TempSrc:  Oral Oral Oral  Resp: 18 18 18 18   Height:      Weight:      SpO2: 100% 100% 100% 100%    ABD soft Sensation intact distally Dorsiflexion/Plantar flexion intact Incision: dressing C/D/I and no drainage Sensation intact in both left lower and upper ext. Short arm splint in place on left upper ext.    Lab Results  Component Value Date   WBC 8.4 07/08/2012   HGB 11.4* 07/08/2012   HCT 33.9* 07/08/2012   MCV 90.2 07/08/2012   PLT 194 07/08/2012     Assessment/Plan: 1 Day Post-Op   Principal Problem:   Osteoarthritis of left hip Active Problems:   Distal radius fracture, left   Advance diet Up with therapy Will try to adjust pain meds to get her some relief, switch to oxycodone WBAT left lower ext. WBAT through the left elbow with platform walker NWB on left wrist   LANDAU,JOSHUA P 07/08/2012, 3:01 PM   Teryl Lucy, MD Cell [phone] Pager [phone]

## 2012-07-09 LAB — CBC
HCT: 32.1 % — ABNORMAL LOW (ref 36.0–46.0)
MCHC: 34 g/dL (ref 30.0–36.0)
RDW: 13.1 % (ref 11.5–15.5)
WBC: 9.5 10*3/uL (ref 4.0–10.5)

## 2012-07-09 LAB — PROTIME-INR
INR: 1.47 (ref 0.00–1.49)
Prothrombin Time: 17.4 seconds — ABNORMAL HIGH (ref 11.6–15.2)

## 2012-07-09 LAB — BASIC METABOLIC PANEL
Chloride: 100 mEq/L (ref 96–112)
GFR calc Af Amer: 71 mL/min — ABNORMAL LOW (ref >90)
GFR calc non Af Amer: 61 mL/min — ABNORMAL LOW (ref >90)
Potassium: 3.8 mEq/L (ref 3.5–5.1)
Sodium: 135 mEq/L (ref 135–145)

## 2012-07-09 MED ORDER — WARFARIN SODIUM 5 MG PO TABS
5.0000 mg | ORAL_TABLET | Freq: Once | ORAL | Status: AC
  Administered 2012-07-09: 5 mg via ORAL
  Filled 2012-07-09 (×2): qty 1

## 2012-07-09 NOTE — Progress Notes (Signed)
Physical Therapy Treatment Patient Details Name: Ruth Ford MRN: [id number] DOB: November 10, 1933 Today's Date: 07/09/2012 Time: 4098-1191 PT Time Calculation (min): 28 min  PT Assessment / Plan / Recommendation Comments on Treatment Session  Pt s/p L THA and ORIF left wrist as well as coccyx fx.  Will benefit from continued PT to address balance, endurance and mobility issues.  Will most likely need a little therapy prior to d/c home for reinforcement of hip and wrist precautions as she was needing a lot of cues for follow precautions.      Follow Up Recommendations  SNF;Supervision/Assistance - 24 hour                 Equipment Recommendations  Rolling walker with 5" wheels        Frequency Min 6X/week   Plan Discharge plan remains appropriate;Frequency remains appropriate    Precautions / Restrictions Precautions Precautions: Fall;Posterior Hip Precaution Booklet Issued: Yes (comment) Precaution Comments: Educated pt on 3/3 posterior hip precautions. Required Braces or Orthoses: Other Brace/Splint Other Brace/Splint: hip abduction pillow Restrictions Weight Bearing Restrictions: Yes LUE Weight Bearing: Non weight bearing (on wrist, WBAT elbow) LLE Weight Bearing: Weight bearing as tolerated Other Position/Activity Restrictions: pt in LUE splint   Pertinent Vitals/Pain VSS, Some left hip pain    Mobility  Bed Mobility Bed Mobility: Not assessed Transfers Transfers: Sit to Stand;Stand to Sit;Stand Pivot Transfers Sit to Stand: 1: +2 Total assist;With upper extremity assist;With armrests;From chair/3-in-1 Sit to Stand: Patient Percentage: 50% Stand to Sit: 1: +2 Total assist;To chair/3-in-1 Stand to Sit: Patient Percentage: 50% Stand Pivot Transfers: 1: +2 Total assist Stand Pivot Transfers: Patient Percentage: 70% Details for Transfer Assistance: Pt originally needed Tot A +2 (pt =50%) but after performing sit to stand 3 x, pt =70%.  Pt got better the more we practice.   Pt needed max cues for left hand placement with transfers to maintain left wrist NWB.  Pt needed cues for Left hip placement as well for hip precautions.  Needed cues to control descent into chair as well.   C/o tailbone pain with sitting therefore placed pillows under bottom to ease this discomfort.   Ambulation/Gait Ambulation/Gait Assistance: 4: Min assist Ambulation Distance (Feet): 14 Feet Assistive device: Left platform walker Ambulation/Gait Assistance Details: Pt ambulated with cues for upright posture as pt staying slightly flexed unless cued constantly.  Pt needed cues to sequence steps and PFRW.  Pt fatigued after 14 feet therefore brought chair to pt.  Once rested, pt needed to use 3N1 therefore transferred pt to 3N1 with pt using PFRW.   Gait Pattern: Step-to pattern;Decreased step length - left;Decreased weight shift to left;Decreased stride length;Trunk flexed;Wide base of support Gait velocity: decreased Stairs: No Wheelchair Mobility Wheelchair Mobility: No    PT Goals Acute Rehab PT Goals PT Goal: Sit to Stand - Progress: Progressing toward goal PT Goal: Stand to Sit - Progress: Progressing toward goal PT Transfer Goal: Bed to Chair/Chair to Bed - Progress: Progressing toward goal PT Goal: Ambulate - Progress: Progressing toward goal Additional Goals PT Goal: Additional Goal #1 - Progress: Progressing toward goal  Visit Information  Last PT Received On: 07/09/12 Assistance Needed: +2 PT/OT Co-Evaluation/Treatment: Yes    Subjective Data  Subjective: "I would feel better if my hip didn't hurt so much."   Cognition  Cognition Overall Cognitive Status: Appears within functional limits for tasks assessed/performed Arousal/Alertness: Awake/alert Orientation Level: Oriented X4 / Intact Behavior During Session: [facility] for tasks performed  Balance  Static Sitting Balance Static Sitting - Level of Assistance: Not tested (comment) Static Standing Balance Static Standing  - Balance Support: Bilateral upper extremity supported;During functional activity Static Standing - Level of Assistance: 4: Min assist Static Standing - Comment/# of Minutes: up to 2 minutes with PFRW.  Pt tried to wipe herself after urinating but PT ended up assisting her.    End of Session PT - End of Session Equipment Utilized During Treatment: Gait belt;Oxygen Activity Tolerance: Patient limited by fatigue;Patient limited by pain Patient left: in chair;with call bell/phone within reach Nurse Communication: Mobility status;Precautions;Weight bearing status        INGOLD,DAWN 07/09/2012, 12:17 PM  [facility] [phone] (pager)

## 2012-07-09 NOTE — Progress Notes (Signed)
ANTICOAGULATION CONSULT NOTE - follow up Pharmacy Consult for Coumadin Indication: VTE prophylaxis  No Known Allergies   Labs:  Recent Labs  07/07/12 1757 07/08/12 0510 07/09/12 0610  HGB 12.7 11.4* 10.9*  HCT 37.2 33.9* 32.1*  PLT 194 194 194  LABPROT 13.7 14.7 17.4*  INR 1.06 1.17 1.47  CREATININE 0.81 0.95 0.89    Estimated Creatinine Clearance: 57.3 ml/min (by C-G formula based on Cr of 0.89).   Assessment: 79 yo F s/p wrist fracture ~ 2 weeks ago and longstanding history of OA to her L hip.  Pt elected to have surgical repair of both at the same time.  Pt now s/p ORIF of L distal radial fracture and THA of L hip.  Coumadin for post-op VTE prophylaxis.  INR = 1.47 (increasing nicely) CBC stable  Goal of Therapy:  INR 2-3   Plan:  1. Repeat coumadin 5 mg po x 1 dose today 2. LMWH 40 qday to be DC'd when INR > = 1.8 per MD orders 3. Daily INR  Thank you. Okey Regal, PharmD [phone]  07/09/2012 12:13 PM

## 2012-07-09 NOTE — Progress Notes (Signed)
Occupational Therapy Re-Evaluation   07/09/12 1200  OT Visit Information  Last OT Received On 07/09/12  Assistance Needed +2  PT/OT Co-Evaluation/Treatment Yes  OT Time Calculation  OT Start Time 1128  OT Stop Time 1158  OT Time Calculation (min) 30 min  Precautions  Precautions Fall;Posterior Hip  Precaution Booklet Issued Yes (comment)  Precaution Comments Educated pt on 3/3 posterior hip precautions.  Restrictions  Weight Bearing Restrictions Yes  LUE Weight Bearing NWB (WBAT through elbow for platform walker)  LLE Weight Bearing WBAT  Other Position/Activity Restrictions hard cast on L wrist  Home Living  Lives With Alone  Available Help at Discharge Family;Available 24 hours/day;Other (Comment)  Type of Home (condo)  Home Access Stairs to enter  Entrance Stairs-Number of Steps 5  Entrance Stairs-Rails None  Home Layout Two level  Alternate Level Stairs-Number of Steps 10  Alternate Level Stairs-Rails Left;Right  Bathroom Shower/Tub Tub/shower unit;Curtain  Horticulturist, commercial Yes  How Accessible Accessible via walker  Home Adaptive Equipment Walker - standard  Prior Function  Level of Independence Independent  Able to Take Stairs? Yes  Driving Yes  Vocation Retired  Geneticist, molecular No difficulties  ADL  Eating/Feeding Performed;Independent  Where Assessed - Eating/Feeding Chair  Upper Body Bathing Simulated;Minimal assistance  Where Assessed - Upper Body Bathing Unsupported sitting  Lower Body Bathing Simulated;Moderate assistance  Where Assessed - Lower Body Bathing Unsupported sitting  Upper Body Dressing Simulated;Minimal assistance  Where Assessed - Upper Body Dressing Unsupported sitting  Lower Body Dressing Simulated;Maximal assistance  Where Assessed - Lower Body Dressing Unsupported sitting  Toilet Transfer Performed;+2 Total assistance  Toilet Transfer: Patient Percentage 70%  Toilet Transfer Method Stand  pivot  Toilet Transfer Equipment Bedside commode  Toileting - Clothing Manipulation and Hygiene Performed;Minimal assistance  Where Assessed - Toileting Clothing Manipulation and Hygiene Sit on 3-in-1 or toilet  Equipment Used Gait belt (platform walker)  Transfers/Ambulation Related to ADLs Pt progressivey demosntrated increased I with each sit<>stand.  Ambulated through room with min assist and platform walker.  After ambulating, pt requesting to use commode.  Rolled pt back to 3n1 in recliner, and she performed SPT from chair<>3n1 with +2 total assist (pt=70%).  ADL Comments VCs throughout session for NWB through left wrist and to maintain hip precautions during all mobility.  Cognition  Overall Cognitive Status Appears within functional limits for tasks assessed/performed  Arousal/Alertness Awake/alert  Orientation Level Appears intact for tasks assessed  Behavior During Session [facility] for tasks performed  Right Upper Extremity Assessment  RUE ROM/Strength/Tone WFL for tasks assessed  RUE Sensation WFL - Light Touch  RUE Coordination WFL - gross/fine motor  Left Upper Extremity Assessment  LUE ROM/Strength/Tone Deficits  LUE ROM/Strength/Tone Deficits No wrist ROM due to cast.  Elbow, shoulder and digit AROM WFL.  LUE Sensation Deficits  LUE Sensation Deficits fingers numb/tingling  Bed Mobility  Bed Mobility Not assessed  Transfers  Transfers Sit to Stand;Stand to Sit  Sit to Stand 1: +2 Total assist;From chair/3-in-1;With armrests;With upper extremity assist (RUE assist)  Sit to Stand: Patient Percentage (50-70%)  Stand to Sit 1: +2 Total assist;To chair/3-in-1  Stand to Sit: Patient Percentage 50%  Details for Transfer Assistance Pt performed sit<>stand x3, each time becoming increasingly independent.  Max VCs for LLE placement and not to push through LLE when performing sit<>stand.  OT - End of Session  Equipment Utilized During Treatment Gait belt (platform walker)  Activity  Tolerance Patient tolerated  treatment well  Patient left in chair;with call bell/phone within reach  Nurse Communication Mobility status  OT Assessment  Clinical Impression Statement Pt admitted after attempting to stand from her couch and falling sustaining L distal radial fx and coccyx fx as well as left hip pain.  Pt now s/p left wrist ORIF and L THA. Will follow acutely. Recommending SNF to progress rehab before returning home.  OT Recommendation/Assessment Patient needs continued OT Services  OT Problem List Decreased strength;Decreased activity tolerance;Impaired balance (sitting and/or standing);Decreased knowledge of use of DME or AE;Decreased knowledge of precautions;Pain;Impaired UE functional use  OT Therapy Diagnosis  Generalized weakness;Acute pain  OT Plan  OT Frequency Min 2X/week  OT Treatment/Interventions Self-care/ADL training;DME and/or AE instruction;Therapeutic activities;Patient/family education;Balance training  OT Recommendation  Follow Up Recommendations SNF  OT Equipment None recommended by OT  Individuals Consulted  Consulted and Agree with Results and Recommendations Patient  Acute Rehab OT Goals  OT Goal Formulation With patient  Time For Goal Achievement 07/23/12  Potential to Achieve Goals Good  ADL Goals  Pt Will Perform Upper Body Bathing with supervision;Sitting, edge of bed  Pt Will Perform Upper Body Dressing with set-up;Sitting, bed  Pt Will Perform Grooming with supervision;Standing at sink  Pt Will Perform Lower Body Bathing with mod assist;Sit to stand from chair;Sit to stand from bed;with adaptive equipment  Pt Will Perform Lower Body Dressing with mod assist;Sit to stand from chair;Sit to stand from bed;with adaptive equipment  ADL Goal: Grooming - Progress Goal set today  ADL Goal: Upper Body Bathing - Progress Goal set today  ADL Goal: Lower Body Bathing - Progress Goal set today  ADL Goal: Upper Body Dressing - Progress Goal set today  ADL  Goal: Lower Body Dressing - Progress Goal set today  Pt Will Transfer to Toilet with supervision;Ambulation;with DME;3-in-1;Maintaining hip precautions  Pt Will Perform Toileting - Clothing Manipulation with supervision;Standing  ADL Goal: Toilet Transfer - Progress Goal set today  ADL Goal: Toileting - Clothing Manipulation - Progress Goal set today  Miscellaneous OT Goals  Miscellaneous OT Goal #1 Pt will perform AROM of those joints not restricted by splint/cast independently.  OT Goal: Miscellaneous Goal #1 - Progress Goal set today  OT General Charges  $OT Visit 1 Procedure  OT Evaluation  $OT Re-eval 1 Procedure  OT Treatments  $Self Care/Home Management  23-37 mins  Written Expression  Dominant Hand Right  07/09/2012 Cipriano Mile OTR/L Pager [phone] Office [phone]

## 2012-07-09 NOTE — Progress Notes (Signed)
Seen in room 4N10  Vital signs stable, low grade fever  S: c/O left hip pain  O: seated in chair eating lunch  A: stable  P: continue Phys There and Warfarin, Transfer to ortho floor 5N

## 2012-07-09 NOTE — Progress Notes (Signed)
Report called to receiving nurse on 5 [place]. Pt. Alert and oriented. Pt.was informed about transfer to orthopedics unit to help with her care. Pt. Stated understanding.

## 2012-07-10 LAB — CBC
MCV: 89.5 fL (ref 78.0–100.0)
Platelets: 209 10*3/uL (ref 150–400)
RBC: 3.44 MIL/uL — ABNORMAL LOW (ref 3.87–5.11)
RDW: 13.1 % (ref 11.5–15.5)
WBC: 9.1 10*3/uL (ref 4.0–10.5)

## 2012-07-10 LAB — PROTIME-INR
INR: 1.59 — ABNORMAL HIGH (ref 0.00–1.49)
Prothrombin Time: 18.5 seconds — ABNORMAL HIGH (ref 11.6–15.2)

## 2012-07-10 MED ORDER — WARFARIN SODIUM 5 MG PO TABS
5.0000 mg | ORAL_TABLET | Freq: Once | ORAL | Status: AC
  Administered 2012-07-10: 5 mg via ORAL
  Filled 2012-07-10: qty 1

## 2012-07-10 NOTE — Progress Notes (Signed)
ANTICOAGULATION CONSULT NOTE - follow up Pharmacy Consult for Coumadin Indication: VTE prophylaxis  No Known Allergies   Labs:  Recent Labs  07/07/12 1757 07/08/12 0510 07/09/12 0610 07/10/12 0655  HGB 12.7 11.4* 10.9* 10.4*  HCT 37.2 33.9* 32.1* 30.8*  PLT 194 194 194 209  LABPROT 13.7 14.7 17.4* 18.5*  INR 1.06 1.17 1.47 1.59*  CREATININE 0.81 0.95 0.89  --     Estimated Creatinine Clearance: 57.3 ml/min (by C-G formula based on Cr of 0.89).   Assessment: 79 yo F s/p wrist fracture ~ 2 weeks ago and longstanding history of OA to her L hip.  Pt elected to have surgical repair of both at the same time.  Pt now s/p ORIF of L distal radial fracture and THA of L hip.  Coumadin for post-op VTE prophylaxis.  INR = 1.59 (increasing nicely) CBC stable  Goal of Therapy:  INR 2-3   Plan:  1. Repeat coumadin 5 mg po x 1 dose today 2. LMWH 40 qday to be DC'd when INR > = 1.8 per MD orders 3. Daily INR  Thank you. Okey Regal, PharmD [phone]  07/10/2012 10:47 AM

## 2012-07-10 NOTE — Clinical Social Work Placement (Addendum)
Clinical Social Work Department CLINICAL SOCIAL WORK PLACEMENT NOTE 07/10/2012  Patient:  Ruth Ford, Ruth Ford  Account Number:  [id number] Admit date:  07/05/2012  Clinical Social Worker:  Dellie Burns, Theresia Majors  Date/time:  07/10/2012 03:00 PM  Clinical Social Work is seeking post-discharge placement for this patient at the following level of care:   SKILLED NURSING   (*CSW will update this form in Epic as items are completed)   07/10/2012  Patient/family provided with [facility] Department of Clinical Social Work's list of facilities offering this level of care within the geographic area requested by the patient (or if unable, by the patient's family).  07/10/2012  Patient/family informed of their freedom to choose among providers that offer the needed level of care, that participate in Medicare, Medicaid or managed care program needed by the patient, have an available bed and are willing to accept the patient.  07/10/2012  Patient/family informed of [facility]' ownership interest in [facility], as well as of the fact that they are under no obligation to receive care at this facility.  PASARR submitted to [employer]DS on 07/08/2012 PASARR number received from [employer] on 07/08/2012  FL2 transmitted to all facilities in geographic area requested by pt/family on  07/10/2012 FL2 transmitted to all facilities within larger geographic area on   Patient informed that his/her managed care company has contracts with or will negotiate with  certain facilities, including the following:     Patient/family informed of bed offers received:  07/11/2012 Patient chooses bed at [facility] Physician recommends and patient chooses bed at    Patient to be transferred to  on  07/11/2012 Patient to be transferred to facility by [facility]  The following physician request were entered in Epic:  Sabino Niemann, MSW,  [phone] Additional Comments: Signing off

## 2012-07-10 NOTE — Clinical Social Work Psychosocial (Signed)
Clinical Social Work Department BRIEF PSYCHOSOCIAL ASSESSMENT 07/10/2012  Patient:  Ruth Ford, Ruth Ford     Account Number:  [id number]     Admit date:  07/05/2012  Clinical Social Worker:  Skip Mayer  Date/Time:  07/10/2012 03:00 PM  Referred by:  Physician  Date Referred:  07/10/2012 Referred for  SNF Placement   Other Referral:   Interview type:  Patient Other interview type:    PSYCHOSOCIAL DATA Living Status:  ALONE Admitted from facility:   Level of care:   Primary support name:  Tammy/niece [phone] Primary support relationship to patient:  FAMILY Degree of support available:   Adequate    CURRENT CONCERNS Current Concerns  Post-Acute Placement   Other Concerns:    SOCIAL WORK ASSESSMENT / PLAN CSW met with pt re: PT/OT recommendation for SNF.  Pt reports she lives alone and identifies her niece as her main support.  CSW explained placement process and answered questions.  Pt requesting [facility] search as her niece lives in [place] and the pt plans to d/c to her niece's home post SNF stay.  CSW completed fl2 and initiated SNF search.  Weekday CSW to f/u with offers.   Assessment/plan status:  Information/Referral to [facility] Other assessment/ plan:   Information/referral to community resources:   SNF  PTAR    PATIENT'S/FAMILY'S RESPONSE TO PLAN OF CARE: Pt reports agreeable to STR in SNF in order to increase strength and independence with mobility prior to d/c to her niece's home.  Pt verbalized understanding of placement process and appreciation for CSW assist.        Dellie Burns, MSW, LCSWA [phone] (Weekends 8:00am-4:30pm)

## 2012-07-10 NOTE — Progress Notes (Signed)
Seen in room 5N11  Afebrile vital signs stable  S: C/O  moderatedly severe hip pain, has not been up with PT today  O: no induration or significant swelling around hip, moves feet and ankles well  A: stable, more than usual Post op hip pain  P: cont PT

## 2012-07-11 DIAGNOSIS — S5290XD Unspecified fracture of unspecified forearm, subsequent encounter for closed fracture with routine healing: Secondary | ICD-10-CM | POA: Diagnosis not present

## 2012-07-11 DIAGNOSIS — S52609A Unspecified fracture of lower end of unspecified ulna, initial encounter for closed fracture: Secondary | ICD-10-CM | POA: Diagnosis not present

## 2012-07-11 DIAGNOSIS — K5900 Constipation, unspecified: Secondary | ICD-10-CM | POA: Diagnosis not present

## 2012-07-11 DIAGNOSIS — S62109A Fracture of unspecified carpal bone, unspecified wrist, initial encounter for closed fracture: Secondary | ICD-10-CM | POA: Diagnosis not present

## 2012-07-11 DIAGNOSIS — M25539 Pain in unspecified wrist: Secondary | ICD-10-CM | POA: Diagnosis not present

## 2012-07-11 DIAGNOSIS — Z4789 Encounter for other orthopedic aftercare: Secondary | ICD-10-CM | POA: Diagnosis not present

## 2012-07-11 DIAGNOSIS — B029 Zoster without complications: Secondary | ICD-10-CM | POA: Diagnosis not present

## 2012-07-11 DIAGNOSIS — E559 Vitamin D deficiency, unspecified: Secondary | ICD-10-CM | POA: Diagnosis not present

## 2012-07-11 DIAGNOSIS — I10 Essential (primary) hypertension: Secondary | ICD-10-CM | POA: Diagnosis not present

## 2012-07-11 DIAGNOSIS — G4733 Obstructive sleep apnea (adult) (pediatric): Secondary | ICD-10-CM | POA: Diagnosis not present

## 2012-07-11 DIAGNOSIS — M810 Age-related osteoporosis without current pathological fracture: Secondary | ICD-10-CM | POA: Diagnosis not present

## 2012-07-11 DIAGNOSIS — R5381 Other malaise: Secondary | ICD-10-CM | POA: Diagnosis not present

## 2012-07-11 DIAGNOSIS — R279 Unspecified lack of coordination: Secondary | ICD-10-CM | POA: Diagnosis not present

## 2012-07-11 DIAGNOSIS — D696 Thrombocytopenia, unspecified: Secondary | ICD-10-CM | POA: Diagnosis not present

## 2012-07-11 DIAGNOSIS — R21 Rash and other nonspecific skin eruption: Secondary | ICD-10-CM | POA: Diagnosis not present

## 2012-07-11 DIAGNOSIS — Z96649 Presence of unspecified artificial hip joint: Secondary | ICD-10-CM | POA: Diagnosis not present

## 2012-07-11 DIAGNOSIS — M1990 Unspecified osteoarthritis, unspecified site: Secondary | ICD-10-CM | POA: Diagnosis not present

## 2012-07-11 DIAGNOSIS — R262 Difficulty in walking, not elsewhere classified: Secondary | ICD-10-CM | POA: Diagnosis not present

## 2012-07-11 LAB — PROTIME-INR
INR: 1.61 — ABNORMAL HIGH (ref 0.00–1.49)
Prothrombin Time: 18.6 seconds — ABNORMAL HIGH (ref 11.6–15.2)

## 2012-07-11 MED ORDER — SENNA-DOCUSATE SODIUM 8.6-50 MG PO TABS: 1.0000 | ORAL_TABLET | Freq: Every day | ORAL | Status: DC

## 2012-07-11 MED ORDER — WARFARIN SODIUM 7.5 MG PO TABS
7.5000 mg | ORAL_TABLET | Freq: Once | ORAL | Status: DC
  Filled 2012-07-11: qty 1

## 2012-07-11 MED ORDER — OXYCODONE-ACETAMINOPHEN 5-325 MG PO TABS: 1.0000 | ORAL_TABLET | Freq: Four times a day (QID) | ORAL | Status: DC | PRN

## 2012-07-11 MED ORDER — WARFARIN SODIUM 5 MG PO TABS: 5.0000 mg | ORAL_TABLET | Freq: Every day | ORAL | Status: DC

## 2012-07-11 NOTE — Discharge Summary (Signed)
Physician Discharge Summary  Patient ID: Ruth Ford MRN: [id number] DOB/AGE: [birth year] 77 y.o.  Admit date: 07/05/2012 Discharge date: 07/11/2012  Admission Diagnoses:  Osteoarthritis of left hip Left distal radius fracture  Discharge Diagnoses:  Principal Problem:   Osteoarthritis of left hip Active Problems:   Distal radius fracture, left   Past Medical History  Diagnosis Date  . Arthritis   . Primary localized osteoarthrosis, lower leg, left 07/08/2012  . Distal radius fracture, left 07/08/2012    Surgeries: Procedure(s): OPEN REDUCTION INTERNAL FIXATION (ORIF) DISTAL RADIAL FRACTURE TOTAL HIP ARTHROPLASTY on 07/05/2012 - 07/07/2012   Consultants (if any):    Discharged Condition: Improved  Hospital Course: Ruth Ford is an 79 y.o. female who was admitted 07/05/2012 with a diagnosis of Osteoarthritis of left hip and left wrist fracture who went to the operating room on 07/05/2012 - 07/07/2012 and underwent the above named procedures.  She was supposed to have her elective THA 2 weeks from now, but broke her wrist and decided to combine the surgical interventions to maximize recovery and minimize anesthesia risk.  She was given perioperative antibiotics:  Anti-infectives   Start     Dose/Rate Route Frequency Ordered Stop   07/07/12 1830  ceFAZolin (ANCEF) IVPB 2 g/50 mL premix     2 g 100 mL/hr over 30 Minutes Intravenous Every 6 hours 07/07/12 1721 07/08/12 0059   07/05/12 1931  ceFAZolin (ANCEF) IVPB 2 g/50 mL premix     2 g 100 mL/hr over 30 Minutes Intravenous 30 min pre-op 07/05/12 1931 07/07/12 1122    .  She was given sequential compression devices, early ambulation, and lovenox bridging to coumadin for DVT prophylaxis.  She benefited maximally from the hospital stay and there were no complications.    Recent vital signs:  Filed Vitals:   07/11/12 0539  BP: 115/60  Pulse: 82  Temp: 98.4 F (36.9 C)  Resp: 16    Recent laboratory studies:  Lab Results   Component Value Date   HGB 10.4* 07/10/2012   HGB 10.9* 07/09/2012   HGB 11.4* 07/08/2012   Lab Results  Component Value Date   WBC 9.1 07/10/2012   PLT 209 07/10/2012   Lab Results  Component Value Date   INR 1.59* 07/10/2012   Lab Results  Component Value Date   NA 135 07/09/2012   K 3.8 07/09/2012   CL 100 07/09/2012   CO2 29 07/09/2012   BUN 9 07/09/2012   CREATININE 0.89 07/09/2012   GLUCOSE 104* 07/09/2012    Discharge Medications:     Medication List    STOP taking these medications       HYDROcodone-acetaminophen 5-325 MG per tablet  Commonly known as:  NORCO/VICODIN      TAKE these medications       famotidine-calcium carbonate-magnesium hydroxide 10-800-165 MG Chew  Commonly known as:  PEPCID COMPLETE  Chew 1 tablet by mouth daily as needed (indigestion).     fish oil-omega-3 fatty acids 1000 MG capsule  Take 1 g by mouth daily.     hydrochlorothiazide 25 MG tablet  Commonly known as:  HYDRODIURIL  Take 25 mg by mouth daily.     oxyCODONE-acetaminophen 5-325 MG per tablet  Commonly known as:  ROXICET  Take 1-2 tablets by mouth every 6 (six) hours as needed for pain.     sennosides-docusate sodium 8.6-50 MG tablet  Commonly known as:  SENOKOT-S  Take 1 tablet by mouth daily.  warfarin 5 MG tablet  Commonly known as:  COUMADIN  Take 1 tablet (5 mg total) by mouth daily.        Diagnostic Studies: X-ray Chest Pa And Lateral   07/06/2012  **ADDENDUM** CREATED: 07/06/2012 00:10:54  Degenerative spurring of the spine.  Mild bronchitic change.  **END ADDENDUM** SIGNED BY: Genevive Bi, M.D.   07/06/2012  *RADIOLOGY REPORT*  Clinical Data: Preop, left wrist fracture  CHEST - 2 VIEW  Comparison: Chest radiograph 03/28/2010  Findings: Cardiac silhouette is mildly enlarged.  No effusion, infiltrate, or pneumothorax.  IMPRESSION: No acute cardiopulmonary process.   Original Report Authenticated By: Genevive Bi, M.D.    Dg Wrist Complete Left  07/07/2012  *RADIOLOGY  REPORT*  Clinical Data: Fracture, pain  DG C-ARM 1-60 MIN,LEFT WRIST - COMPLETE 3+ VIEW  Comparison: 07/04/2012  Findings: Open reduction internal fixation with plate and screw fixation.  IMPRESSION: Improved position and alignment   Original Report Authenticated By: Davonna Belling, M.D.    Dg Wrist Complete Left  07/04/2012  *RADIOLOGY REPORT*  Clinical Data: Injury, left wrist pain post fall today  LEFT WRIST - COMPLETE 3+ VIEW  Comparison: None  Findings: Osseous demineralization. Transverse metaphyseal fracture distal left radius with dorsal displacement and apex volar angulation. Probable intra-articular extension into radiocarpal joint near dorsal margin. No additional fracture or dislocation identified. Degenerative changes at the STT joint. Acquired ulnar plus variance.  IMPRESSION: Displaced angulated and likely intra-articular distal right radial metaphyseal fracture.   Original Report Authenticated By: Ulyses Southward, M.D.    Dg Pelvis Portable  07/07/2012  *RADIOLOGY REPORT*  Clinical Data: Postop left hip replacement.  PORTABLE PELVIS  Comparison: None.  Findings: Changes of left hip replacement.  Normal AP alignment. No hardware or bony complicating feature.  Advanced degenerative changes in the right hip.  IMPRESSION: Left hip replacement.  No complicating feature.   Original Report Authenticated By: Charlett Nose, M.D.    Dg Hip Portable 1 View Left  07/07/2012  *RADIOLOGY REPORT*  Clinical Data: Postop left hip replacement  PORTABLE LEFT HIP - 1 VIEW  Comparison: Preoperative left hip films of 02/25/2012  Findings: A cross-table lateral portable views of the shows the femoral component of the left hip replacement to be in good position.  No complicating features are seen.  IMPRESSION: Femoral component of left hip replacement appears been good position.   Original Report Authenticated By: Dwyane Dee, M.D.    Dg C-arm 1-60 Min  07/07/2012  *RADIOLOGY REPORT*  Clinical Data: Fracture, pain  DG C-ARM  1-60 MIN,LEFT WRIST - COMPLETE 3+ VIEW  Comparison: 07/04/2012  Findings: Open reduction internal fixation with plate and screw fixation.  IMPRESSION: Improved position and alignment   Original Report Authenticated By: Davonna Belling, M.D.     Disposition: SNF      Discharge Orders   Future Appointments [facility] Department Dept Phone   07/18/2012 11:00 AM [place] [facility] SAME DAY SURGERY [phone]   Future Orders Complete By Expires     Call MD / Call 911  As directed     Comments:      If you experience chest pain or shortness of breath, CALL 911 and be transported to the hospital emergency room.  If you develope a fever above 101 F, pus (white drainage) or increased drainage or redness at the wound, or calf pain, call your surgeon's office.    Constipation Prevention  As directed     Comments:  Drink plenty of fluids.  Prune juice may be helpful.  You may use a stool softener, such as Colace (over the counter) 100 mg twice a day.  Use MiraLax (over the counter) for constipation as needed.    Diet general  As directed     Discharge instructions  As directed     Comments:      Change dressing in 3 days and reapply fresh dressing, unless you have a splint (half cast).  If you have a splint/cast, just leave in place until your follow-up appointment.    Keep wounds dry for 3 weeks.  Leave steri-strips in place on skin.  Do not apply lotion or anything to the wound.    Non weight bearing  As directed     Comments:      Left wrist/hand    Posterior total hip precautions  As directed     Weight bearing as tolerated  As directed     Comments:      Bilateral lower extremities, right upper extremity, left upper extremity is WBAT with a platform crutch       Follow-up Information   Follow up with Eulas Post, MD. Schedule an appointment as soon as possible for a visit in 2 weeks.   Contact information:   [address] [place]  [address] [phone]        Signed: Eulas Post 07/11/2012, 8:28 AM

## 2012-07-11 NOTE — Progress Notes (Signed)
Patient discharged to SNF in stable condition for further rehabilitation.

## 2012-07-11 NOTE — Progress Notes (Signed)
ANTICOAGULATION CONSULT NOTE - follow up Pharmacy Consult for Coumadin Indication: VTE prophylaxis  No Known Allergies   Labs:  Recent Labs  07/09/12 0610 07/10/12 0655 07/11/12 0838  HGB 10.9* 10.4*  --   HCT 32.1* 30.8*  --   PLT 194 209  --   LABPROT 17.4* 18.5* 18.6*  INR 1.47 1.59* 1.61*  CREATININE 0.89  --   --     Estimated Creatinine Clearance: 57.3 ml/min (by C-G formula based on Cr of 0.89).   Assessment: 79 yo F s/p wrist fracture ~ 2 weeks ago and longstanding history of OA to her L hip.  Pt elected to have surgical repair of both at the same time.  Pt now s/p ORIF of L distal radial fracture and THA of L hip.  Coumadin for post-op VTE prophylaxis.  INR = 1.61 (subtherapeutic but trending up) No CBC today No bleeding reported  Goal of Therapy:  INR 2-3   Plan:  1) Increase coumadin to 7.5mg  x 1 today prior to discharge then can resume 5mg  daily 2) Follow up INR in AM if still here  Fredrik Rigger 07/11/12, 10:51AM

## 2012-07-13 ENCOUNTER — Encounter (HOSPITAL_COMMUNITY): Payer: Self-pay | Admitting: Orthopedic Surgery

## 2012-07-13 DIAGNOSIS — M1990 Unspecified osteoarthritis, unspecified site: Secondary | ICD-10-CM | POA: Diagnosis not present

## 2012-07-13 DIAGNOSIS — I10 Essential (primary) hypertension: Secondary | ICD-10-CM | POA: Diagnosis not present

## 2012-07-13 DIAGNOSIS — K5900 Constipation, unspecified: Secondary | ICD-10-CM | POA: Diagnosis not present

## 2012-07-15 DIAGNOSIS — R21 Rash and other nonspecific skin eruption: Secondary | ICD-10-CM | POA: Diagnosis not present

## 2012-07-18 ENCOUNTER — Other Ambulatory Visit (HOSPITAL_COMMUNITY): Payer: Medicare Other

## 2012-07-19 DIAGNOSIS — Z96649 Presence of unspecified artificial hip joint: Secondary | ICD-10-CM | POA: Diagnosis not present

## 2012-07-19 DIAGNOSIS — S52609A Unspecified fracture of lower end of unspecified ulna, initial encounter for closed fracture: Secondary | ICD-10-CM | POA: Diagnosis not present

## 2012-07-19 DIAGNOSIS — B029 Zoster without complications: Secondary | ICD-10-CM | POA: Diagnosis not present

## 2012-07-22 DIAGNOSIS — D696 Thrombocytopenia, unspecified: Secondary | ICD-10-CM | POA: Diagnosis not present

## 2012-07-25 ENCOUNTER — Inpatient Hospital Stay (HOSPITAL_COMMUNITY): Admission: RE | Admit: 2012-07-25 | Payer: Medicare Other | Source: Ambulatory Visit | Admitting: Orthopedic Surgery

## 2012-07-25 ENCOUNTER — Encounter (HOSPITAL_COMMUNITY): Admission: RE | Payer: Self-pay | Source: Ambulatory Visit

## 2012-07-25 DIAGNOSIS — Z96649 Presence of unspecified artificial hip joint: Secondary | ICD-10-CM | POA: Diagnosis not present

## 2012-07-25 DIAGNOSIS — S52609A Unspecified fracture of lower end of unspecified ulna, initial encounter for closed fracture: Secondary | ICD-10-CM | POA: Diagnosis not present

## 2012-07-25 DIAGNOSIS — B029 Zoster without complications: Secondary | ICD-10-CM | POA: Diagnosis not present

## 2012-07-25 SURGERY — ARTHROPLASTY, HIP, TOTAL,POSTERIOR APPROACH
Anesthesia: General | Laterality: Left

## 2012-08-02 DIAGNOSIS — M1990 Unspecified osteoarthritis, unspecified site: Secondary | ICD-10-CM | POA: Diagnosis not present

## 2012-08-02 DIAGNOSIS — Z4789 Encounter for other orthopedic aftercare: Secondary | ICD-10-CM | POA: Diagnosis not present

## 2012-08-02 DIAGNOSIS — R279 Unspecified lack of coordination: Secondary | ICD-10-CM | POA: Diagnosis not present

## 2012-08-02 DIAGNOSIS — K5900 Constipation, unspecified: Secondary | ICD-10-CM | POA: Diagnosis not present

## 2012-08-02 DIAGNOSIS — I10 Essential (primary) hypertension: Secondary | ICD-10-CM | POA: Diagnosis not present

## 2012-08-02 DIAGNOSIS — S62109A Fracture of unspecified carpal bone, unspecified wrist, initial encounter for closed fracture: Secondary | ICD-10-CM | POA: Diagnosis not present

## 2012-08-02 DIAGNOSIS — B029 Zoster without complications: Secondary | ICD-10-CM | POA: Diagnosis not present

## 2012-08-02 DIAGNOSIS — R262 Difficulty in walking, not elsewhere classified: Secondary | ICD-10-CM | POA: Diagnosis not present

## 2012-08-02 DIAGNOSIS — G4733 Obstructive sleep apnea (adult) (pediatric): Secondary | ICD-10-CM | POA: Diagnosis not present

## 2012-08-05 DIAGNOSIS — B029 Zoster without complications: Secondary | ICD-10-CM | POA: Diagnosis not present

## 2012-08-05 DIAGNOSIS — M1990 Unspecified osteoarthritis, unspecified site: Secondary | ICD-10-CM | POA: Diagnosis not present

## 2012-08-05 DIAGNOSIS — K5900 Constipation, unspecified: Secondary | ICD-10-CM | POA: Diagnosis not present

## 2012-08-05 DIAGNOSIS — I10 Essential (primary) hypertension: Secondary | ICD-10-CM | POA: Diagnosis not present

## 2012-09-02 DIAGNOSIS — S62109A Fracture of unspecified carpal bone, unspecified wrist, initial encounter for closed fracture: Secondary | ICD-10-CM | POA: Diagnosis not present

## 2012-09-02 DIAGNOSIS — M1990 Unspecified osteoarthritis, unspecified site: Secondary | ICD-10-CM | POA: Diagnosis not present

## 2012-09-02 DIAGNOSIS — K5900 Constipation, unspecified: Secondary | ICD-10-CM | POA: Diagnosis not present

## 2012-09-02 DIAGNOSIS — I10 Essential (primary) hypertension: Secondary | ICD-10-CM | POA: Diagnosis not present

## 2012-09-25 DIAGNOSIS — R279 Unspecified lack of coordination: Secondary | ICD-10-CM | POA: Diagnosis not present

## 2012-09-25 DIAGNOSIS — I10 Essential (primary) hypertension: Secondary | ICD-10-CM | POA: Diagnosis not present

## 2012-09-25 DIAGNOSIS — Z9181 History of falling: Secondary | ICD-10-CM | POA: Diagnosis not present

## 2012-09-25 DIAGNOSIS — M159 Polyosteoarthritis, unspecified: Secondary | ICD-10-CM | POA: Diagnosis not present

## 2012-09-25 DIAGNOSIS — S72009D Fracture of unspecified part of neck of unspecified femur, subsequent encounter for closed fracture with routine healing: Secondary | ICD-10-CM | POA: Diagnosis not present

## 2012-09-25 DIAGNOSIS — R262 Difficulty in walking, not elsewhere classified: Secondary | ICD-10-CM | POA: Diagnosis not present

## 2012-09-29 DIAGNOSIS — M159 Polyosteoarthritis, unspecified: Secondary | ICD-10-CM | POA: Diagnosis not present

## 2012-09-29 DIAGNOSIS — R262 Difficulty in walking, not elsewhere classified: Secondary | ICD-10-CM | POA: Diagnosis not present

## 2012-09-29 DIAGNOSIS — Z9181 History of falling: Secondary | ICD-10-CM | POA: Diagnosis not present

## 2012-09-29 DIAGNOSIS — I10 Essential (primary) hypertension: Secondary | ICD-10-CM | POA: Diagnosis not present

## 2012-09-29 DIAGNOSIS — S72009D Fracture of unspecified part of neck of unspecified femur, subsequent encounter for closed fracture with routine healing: Secondary | ICD-10-CM | POA: Diagnosis not present

## 2012-09-29 DIAGNOSIS — R279 Unspecified lack of coordination: Secondary | ICD-10-CM | POA: Diagnosis not present

## 2012-09-30 DIAGNOSIS — R262 Difficulty in walking, not elsewhere classified: Secondary | ICD-10-CM | POA: Diagnosis not present

## 2012-09-30 DIAGNOSIS — Z9181 History of falling: Secondary | ICD-10-CM | POA: Diagnosis not present

## 2012-09-30 DIAGNOSIS — M159 Polyosteoarthritis, unspecified: Secondary | ICD-10-CM | POA: Diagnosis not present

## 2012-09-30 DIAGNOSIS — R279 Unspecified lack of coordination: Secondary | ICD-10-CM | POA: Diagnosis not present

## 2012-09-30 DIAGNOSIS — I10 Essential (primary) hypertension: Secondary | ICD-10-CM | POA: Diagnosis not present

## 2012-09-30 DIAGNOSIS — S72009D Fracture of unspecified part of neck of unspecified femur, subsequent encounter for closed fracture with routine healing: Secondary | ICD-10-CM | POA: Diagnosis not present

## 2012-10-03 DIAGNOSIS — Z6827 Body mass index (BMI) 27.0-27.9, adult: Secondary | ICD-10-CM | POA: Diagnosis not present

## 2012-10-03 DIAGNOSIS — G8929 Other chronic pain: Secondary | ICD-10-CM | POA: Diagnosis not present

## 2012-10-03 DIAGNOSIS — R7301 Impaired fasting glucose: Secondary | ICD-10-CM | POA: Diagnosis not present

## 2012-10-03 DIAGNOSIS — E785 Hyperlipidemia, unspecified: Secondary | ICD-10-CM | POA: Diagnosis not present

## 2012-10-03 DIAGNOSIS — I10 Essential (primary) hypertension: Secondary | ICD-10-CM | POA: Diagnosis not present

## 2012-10-04 DIAGNOSIS — M159 Polyosteoarthritis, unspecified: Secondary | ICD-10-CM | POA: Diagnosis not present

## 2012-10-04 DIAGNOSIS — I10 Essential (primary) hypertension: Secondary | ICD-10-CM | POA: Diagnosis not present

## 2012-10-04 DIAGNOSIS — R279 Unspecified lack of coordination: Secondary | ICD-10-CM | POA: Diagnosis not present

## 2012-10-04 DIAGNOSIS — Z9181 History of falling: Secondary | ICD-10-CM | POA: Diagnosis not present

## 2012-10-04 DIAGNOSIS — R262 Difficulty in walking, not elsewhere classified: Secondary | ICD-10-CM | POA: Diagnosis not present

## 2012-10-04 DIAGNOSIS — S72009D Fracture of unspecified part of neck of unspecified femur, subsequent encounter for closed fracture with routine healing: Secondary | ICD-10-CM | POA: Diagnosis not present

## 2012-10-05 DIAGNOSIS — Z9181 History of falling: Secondary | ICD-10-CM | POA: Diagnosis not present

## 2012-10-05 DIAGNOSIS — I10 Essential (primary) hypertension: Secondary | ICD-10-CM | POA: Diagnosis not present

## 2012-10-05 DIAGNOSIS — M159 Polyosteoarthritis, unspecified: Secondary | ICD-10-CM | POA: Diagnosis not present

## 2012-10-05 DIAGNOSIS — R262 Difficulty in walking, not elsewhere classified: Secondary | ICD-10-CM | POA: Diagnosis not present

## 2012-10-05 DIAGNOSIS — R279 Unspecified lack of coordination: Secondary | ICD-10-CM | POA: Diagnosis not present

## 2012-10-05 DIAGNOSIS — S72009D Fracture of unspecified part of neck of unspecified femur, subsequent encounter for closed fracture with routine healing: Secondary | ICD-10-CM | POA: Diagnosis not present

## 2012-10-06 DIAGNOSIS — R262 Difficulty in walking, not elsewhere classified: Secondary | ICD-10-CM | POA: Diagnosis not present

## 2012-10-06 DIAGNOSIS — Z9181 History of falling: Secondary | ICD-10-CM | POA: Diagnosis not present

## 2012-10-06 DIAGNOSIS — S72009D Fracture of unspecified part of neck of unspecified femur, subsequent encounter for closed fracture with routine healing: Secondary | ICD-10-CM | POA: Diagnosis not present

## 2012-10-06 DIAGNOSIS — I10 Essential (primary) hypertension: Secondary | ICD-10-CM | POA: Diagnosis not present

## 2012-10-06 DIAGNOSIS — M159 Polyosteoarthritis, unspecified: Secondary | ICD-10-CM | POA: Diagnosis not present

## 2012-10-06 DIAGNOSIS — R279 Unspecified lack of coordination: Secondary | ICD-10-CM | POA: Diagnosis not present

## 2012-10-07 DIAGNOSIS — Z9181 History of falling: Secondary | ICD-10-CM | POA: Diagnosis not present

## 2012-10-07 DIAGNOSIS — I10 Essential (primary) hypertension: Secondary | ICD-10-CM | POA: Diagnosis not present

## 2012-10-07 DIAGNOSIS — R279 Unspecified lack of coordination: Secondary | ICD-10-CM | POA: Diagnosis not present

## 2012-10-07 DIAGNOSIS — M159 Polyosteoarthritis, unspecified: Secondary | ICD-10-CM | POA: Diagnosis not present

## 2012-10-07 DIAGNOSIS — S72009D Fracture of unspecified part of neck of unspecified femur, subsequent encounter for closed fracture with routine healing: Secondary | ICD-10-CM | POA: Diagnosis not present

## 2012-10-07 DIAGNOSIS — R262 Difficulty in walking, not elsewhere classified: Secondary | ICD-10-CM | POA: Diagnosis not present

## 2012-10-10 DIAGNOSIS — Z9181 History of falling: Secondary | ICD-10-CM | POA: Diagnosis not present

## 2012-10-10 DIAGNOSIS — R262 Difficulty in walking, not elsewhere classified: Secondary | ICD-10-CM | POA: Diagnosis not present

## 2012-10-10 DIAGNOSIS — S72009D Fracture of unspecified part of neck of unspecified femur, subsequent encounter for closed fracture with routine healing: Secondary | ICD-10-CM | POA: Diagnosis not present

## 2012-10-10 DIAGNOSIS — R279 Unspecified lack of coordination: Secondary | ICD-10-CM | POA: Diagnosis not present

## 2012-10-10 DIAGNOSIS — M159 Polyosteoarthritis, unspecified: Secondary | ICD-10-CM | POA: Diagnosis not present

## 2012-10-10 DIAGNOSIS — I10 Essential (primary) hypertension: Secondary | ICD-10-CM | POA: Diagnosis not present

## 2012-10-12 DIAGNOSIS — I10 Essential (primary) hypertension: Secondary | ICD-10-CM | POA: Diagnosis not present

## 2012-10-12 DIAGNOSIS — R279 Unspecified lack of coordination: Secondary | ICD-10-CM | POA: Diagnosis not present

## 2012-10-12 DIAGNOSIS — Z9181 History of falling: Secondary | ICD-10-CM | POA: Diagnosis not present

## 2012-10-12 DIAGNOSIS — S72009D Fracture of unspecified part of neck of unspecified femur, subsequent encounter for closed fracture with routine healing: Secondary | ICD-10-CM | POA: Diagnosis not present

## 2012-10-12 DIAGNOSIS — M159 Polyosteoarthritis, unspecified: Secondary | ICD-10-CM | POA: Diagnosis not present

## 2012-10-12 DIAGNOSIS — R262 Difficulty in walking, not elsewhere classified: Secondary | ICD-10-CM | POA: Diagnosis not present

## 2012-10-12 DIAGNOSIS — M1610 Unilateral primary osteoarthritis, unspecified hip: Secondary | ICD-10-CM | POA: Diagnosis not present

## 2012-10-12 DIAGNOSIS — M25539 Pain in unspecified wrist: Secondary | ICD-10-CM | POA: Diagnosis not present

## 2012-10-14 DIAGNOSIS — R279 Unspecified lack of coordination: Secondary | ICD-10-CM | POA: Diagnosis not present

## 2012-10-14 DIAGNOSIS — Z9181 History of falling: Secondary | ICD-10-CM | POA: Diagnosis not present

## 2012-10-14 DIAGNOSIS — I10 Essential (primary) hypertension: Secondary | ICD-10-CM | POA: Diagnosis not present

## 2012-10-14 DIAGNOSIS — R262 Difficulty in walking, not elsewhere classified: Secondary | ICD-10-CM | POA: Diagnosis not present

## 2012-10-14 DIAGNOSIS — S72009D Fracture of unspecified part of neck of unspecified femur, subsequent encounter for closed fracture with routine healing: Secondary | ICD-10-CM | POA: Diagnosis not present

## 2012-10-14 DIAGNOSIS — M159 Polyosteoarthritis, unspecified: Secondary | ICD-10-CM | POA: Diagnosis not present

## 2012-10-17 DIAGNOSIS — R262 Difficulty in walking, not elsewhere classified: Secondary | ICD-10-CM | POA: Diagnosis not present

## 2012-10-17 DIAGNOSIS — R279 Unspecified lack of coordination: Secondary | ICD-10-CM | POA: Diagnosis not present

## 2012-10-17 DIAGNOSIS — I10 Essential (primary) hypertension: Secondary | ICD-10-CM | POA: Diagnosis not present

## 2012-10-17 DIAGNOSIS — M159 Polyosteoarthritis, unspecified: Secondary | ICD-10-CM | POA: Diagnosis not present

## 2012-10-17 DIAGNOSIS — S72009D Fracture of unspecified part of neck of unspecified femur, subsequent encounter for closed fracture with routine healing: Secondary | ICD-10-CM | POA: Diagnosis not present

## 2012-10-17 DIAGNOSIS — Z9181 History of falling: Secondary | ICD-10-CM | POA: Diagnosis not present

## 2012-10-19 DIAGNOSIS — Z9181 History of falling: Secondary | ICD-10-CM | POA: Diagnosis not present

## 2012-10-19 DIAGNOSIS — R279 Unspecified lack of coordination: Secondary | ICD-10-CM | POA: Diagnosis not present

## 2012-10-19 DIAGNOSIS — M159 Polyosteoarthritis, unspecified: Secondary | ICD-10-CM | POA: Diagnosis not present

## 2012-10-19 DIAGNOSIS — S72009D Fracture of unspecified part of neck of unspecified femur, subsequent encounter for closed fracture with routine healing: Secondary | ICD-10-CM | POA: Diagnosis not present

## 2012-10-19 DIAGNOSIS — R262 Difficulty in walking, not elsewhere classified: Secondary | ICD-10-CM | POA: Diagnosis not present

## 2012-10-19 DIAGNOSIS — I10 Essential (primary) hypertension: Secondary | ICD-10-CM | POA: Diagnosis not present

## 2012-11-03 DIAGNOSIS — G8929 Other chronic pain: Secondary | ICD-10-CM | POA: Diagnosis not present

## 2012-11-03 DIAGNOSIS — Z6827 Body mass index (BMI) 27.0-27.9, adult: Secondary | ICD-10-CM | POA: Diagnosis not present

## 2013-01-05 DIAGNOSIS — G8929 Other chronic pain: Secondary | ICD-10-CM | POA: Diagnosis not present

## 2013-01-05 DIAGNOSIS — Z6827 Body mass index (BMI) 27.0-27.9, adult: Secondary | ICD-10-CM | POA: Diagnosis not present

## 2013-01-05 DIAGNOSIS — M159 Polyosteoarthritis, unspecified: Secondary | ICD-10-CM | POA: Diagnosis not present

## 2013-01-11 DIAGNOSIS — M25539 Pain in unspecified wrist: Secondary | ICD-10-CM | POA: Diagnosis not present

## 2013-01-11 DIAGNOSIS — M1610 Unilateral primary osteoarthritis, unspecified hip: Secondary | ICD-10-CM | POA: Diagnosis not present

## 2013-01-13 DIAGNOSIS — M1610 Unilateral primary osteoarthritis, unspecified hip: Secondary | ICD-10-CM | POA: Diagnosis not present

## 2013-01-28 ENCOUNTER — Emergency Department (HOSPITAL_COMMUNITY)
Admission: EM | Admit: 2013-01-28 | Discharge: 2013-01-28 | Disposition: A | Discharge status: Home / Self Care | Payer: Medicare Other | Attending: Emergency Medicine | Admitting: Emergency Medicine

## 2013-01-28 ENCOUNTER — Encounter (HOSPITAL_COMMUNITY): Payer: Self-pay

## 2013-01-28 DIAGNOSIS — H00019 Hordeolum externum unspecified eye, unspecified eyelid: Secondary | ICD-10-CM | POA: Insufficient documentation

## 2013-01-28 DIAGNOSIS — H5789 Other specified disorders of eye and adnexa: Secondary | ICD-10-CM | POA: Insufficient documentation

## 2013-01-28 DIAGNOSIS — Z7901 Long term (current) use of anticoagulants: Secondary | ICD-10-CM | POA: Insufficient documentation

## 2013-01-28 DIAGNOSIS — Z79899 Other long term (current) drug therapy: Secondary | ICD-10-CM | POA: Diagnosis not present

## 2013-01-28 DIAGNOSIS — Z8781 Personal history of (healed) traumatic fracture: Secondary | ICD-10-CM | POA: Diagnosis not present

## 2013-01-28 DIAGNOSIS — H00013 Hordeolum externum right eye, unspecified eyelid: Secondary | ICD-10-CM

## 2013-01-28 DIAGNOSIS — M129 Arthropathy, unspecified: Secondary | ICD-10-CM | POA: Insufficient documentation

## 2013-01-28 DIAGNOSIS — IMO0002 Reserved for concepts with insufficient information to code with codable children: Secondary | ICD-10-CM | POA: Diagnosis not present

## 2013-01-28 DIAGNOSIS — M1710 Unilateral primary osteoarthritis, unspecified knee: Secondary | ICD-10-CM | POA: Insufficient documentation

## 2013-01-28 MED ORDER — SULFAMETHOXAZOLE-TMP DS 800-160 MG PO TABS: 1.0000 | ORAL_TABLET | Freq: Once | ORAL | Status: DC

## 2013-01-28 MED ORDER — CLINDAMYCIN HCL 150 MG PO CAPS: 300.0000 mg | ORAL_CAPSULE | Freq: Three times a day (TID) | ORAL | Status: DC

## 2013-01-28 MED ORDER — CLINDAMYCIN HCL 300 MG PO CAPS
300.0000 mg | ORAL_CAPSULE | Freq: Once | ORAL | Status: AC
  Administered 2013-01-28: 300 mg via ORAL
  Filled 2013-01-28: qty 1

## 2013-01-28 NOTE — ED Provider Notes (Signed)
  This was a shared visit with a mid-level provided (NP or PA).  Throughout the patient's course I was available for consultation/collaboration.  On my exam the patient was in no distress, though she had a notable discharge from her left eye.  She was appropriate for discharge with close outpatient ophthalmologic followup      Gerhard Munch, MD 01/28/13 2350

## 2013-01-28 NOTE — ED Notes (Addendum)
Pt c/o R eye stye on bottom lid, drainage and pain x 2-3 days.  Pain score 6/10.  Denies blurred vision.  Sts stye top lid x 3 weeks ago.  Redness and swelling noted below R eye.

## 2013-01-28 NOTE — ED Provider Notes (Signed)
CSN: [id number]     Arrival date & time 01/28/13  1841 History  This chart was scribed for non-physician practitioner Francee Piccolo, working with Gerhard Munch, MD by Carl Best, ED Scribe. This patient was seen in room WTR8/WTR8 and the patient's care was started at 8:28 PM.    Chief Complaint  Patient presents with  . Eye Pain    Patient is a 79 y.o. female presenting with eye pain. The history is provided by the patient and a relative. No language interpreter was used.  Eye Pain Pertinent negatives include no chest pain, no abdominal pain, no headaches and no shortness of breath.   HPI Comments: Ruth Ford is a 79 y.o. female brought in by her niece who presents to the Emergency Department complaining of a constant right eyelid pain. The patient's niece states that she noticed a stye forming at the bottom and top of the the patient's right eyelid three days ago.  The patient states that the stye on the upper right eyelid has decreased in size but the stye located at the bottom right eyelid is inflamed and causing her eyelid pain.  She rates the pain at a 6/10 when she palpates the right eyelid, but otherwise she has no pain.  She states that there is some drainage from the stye itself.  She states that she normally wears contact lenses in her right eye but has not worn them since the onset of the stye 3 weeks ago. She states that she has applied stye ointment to the affected area with no relief of the swelling or drainage.  She denies fevers, photophobia, chills, rhinorrhea, or sore throat as associated symptoms.  She denies any sick contacts.     Past Medical History  Diagnosis Date  . Arthritis   . Primary localized osteoarthrosis, lower leg, left 07/08/2012  . Distal radius fracture, left 07/08/2012   Past Surgical History  Procedure Laterality Date  . Back surgery    . Open reduction internal fixation (orif) distal radial fracture Left 07/07/2012    Procedure: OPEN REDUCTION  INTERNAL FIXATION (ORIF) DISTAL RADIAL FRACTURE;  Surgeon: Eulas Post, MD;  Location: [facility] OR;  Service: Orthopedics;  Laterality: Left;  . Total hip arthroplasty Left 07/07/2012    Procedure: TOTAL HIP ARTHROPLASTY;  Surgeon: Eulas Post, MD;  Location: [facility] OR;  Service: Orthopedics;  Laterality: Left;   History reviewed. No pertinent family history. History  Substance Use Topics  . Smoking status: Never Smoker   . Smokeless tobacco: Not on file  . Alcohol Use: No   OB History   Grav Para Term Preterm Abortions TAB SAB Ect Mult Living                 Review of Systems  Constitutional: Negative for fever and chills.  HENT: Negative for ear pain, sore throat, facial swelling, rhinorrhea, neck pain and neck stiffness.   Eyes: Negative for photophobia, discharge, redness, itching and visual disturbance. Eye pain: bottom right eyelid.       Eyelid pain  Respiratory: Negative for shortness of breath.   Cardiovascular: Negative for chest pain.  Gastrointestinal: Negative for abdominal pain.  Neurological: Negative for headaches.    Allergies  Review of patient's allergies indicates no known allergies.  Home Medications   Current Outpatient Rx  Name  Route  Sig  Dispense  Refill  . clindamycin (CLEOCIN) 150 MG capsule   Oral   Take 2 capsules (300 mg total) by  mouth 3 (three) times daily. May dispense as 150mg  capsules   60 capsule   0   . famotidine-calcium carbonate-magnesium hydroxide (PEPCID COMPLETE) 10-800-165 MG CHEW   Oral   Chew 1 tablet by mouth daily as needed (indigestion).          . fish oil-omega-3 fatty acids 1000 MG capsule   Oral   Take 1 g by mouth daily.          . hydrochlorothiazide (HYDRODIURIL) 25 MG tablet   Oral   Take 25 mg by mouth daily.         Marland Kitchen oxyCODONE-acetaminophen (ROXICET) 5-325 MG per tablet   Oral   Take 1-2 tablets by mouth every 6 (six) hours as needed for pain.   75 tablet   0   . sennosides-docusate sodium  (SENOKOT-S) 8.6-50 MG tablet   Oral   Take 1 tablet by mouth daily.   30 tablet   1   . warfarin (COUMADIN) 5 MG tablet   Oral   Take 1 tablet (5 mg total) by mouth daily.   30 tablet   1     Exact dose to be managed based on protocol by Home ...    Triage Vitals: BP 118/66  Pulse 83  Temp(Src) 98.4 F (36.9 C)  Resp 16  SpO2 98%  Physical Exam  Constitutional: She is oriented to person, place, and time. She appears well-developed and well-nourished. No distress.  HENT:  Head: Normocephalic and atraumatic.  Right Ear: External ear normal.  Left Ear: External ear normal.  Nose: Nose normal.  Mouth/Throat: Oropharynx is clear and moist. No oropharyngeal exudate.  Eyes: Conjunctivae and EOM are normal. Pupils are equal, round, and reactive to light. Right eye exhibits discharge (watery discharge) and hordeolum. Right eye exhibits no exudate. No foreign body present in the right eye. Left eye exhibits no chemosis, no discharge, no exudate and no hordeolum. No foreign body present in the left eye. No scleral icterus.  Fundoscopic exam:      The right eye shows no exudate, no hemorrhage and no papilledema.       The left eye shows no exudate, no hemorrhage and no papilledema.  Minimally tender area below left lower eyelid. No warmth.   Neck: Normal range of motion. Neck supple.  Cardiovascular: Normal rate, regular rhythm and normal heart sounds.   Pulmonary/Chest: Effort normal and breath sounds normal. No respiratory distress.  Abdominal: Soft.  Musculoskeletal: She exhibits no edema.  Lymphadenopathy:    She has no cervical adenopathy.  Neurological: She is alert and oriented to person, place, and time.  Skin: Skin is warm and dry. She is not diaphoretic.  Psychiatric: She has a normal mood and affect.    ED Course  Procedures (including critical care time)  DIAGNOSTIC STUDIES: Oxygen Saturation is 98% on room air, normal by my interpretation.    COORDINATION OF  CARE: 7:23 PM- Advised the patient to consult an ophthalmologist for a follow up appointment and discharging the patient with antibiotics to treat the infected skin under the stye.  Advised the patient to apply a warm compress to the right eye to decrease the swelling under the eye.  The patient agreed to the treatment plan.    Labs Review Labs Reviewed - No data to display Imaging Review No results found.  MDM   1. Hordeolum eyelid, right    Afebrile, NAD, non-toxic appearing, AAOx4. Patient with right-sided upper and lower lid sty. Right-sided  lower lip with minimally erythematous tender to palpation area. Area is not warm. No drainage noted. No signs of periorbital cellulitis. Visual acuity intact. No concern for intraorbital pathology. Lower for patient on-call ophthalmologist. Will start patient on clindamycin for possible infection with staph coverage. Advised to use warm compresses. Return precautions discussed. Patient agreeable to plan. Patient stable at time of discharge. Patient d/w with Dr. Jeraldine Loots, agrees with plan.    I personally performed the services described in this documentation, which was scribed in my presence. The recorded information has been reviewed and is accurate.     Jeannetta Ellis, PA-C 01/28/13 2028

## 2013-02-16 DIAGNOSIS — Z6826 Body mass index (BMI) 26.0-26.9, adult: Secondary | ICD-10-CM | POA: Diagnosis not present

## 2013-02-16 DIAGNOSIS — G8929 Other chronic pain: Secondary | ICD-10-CM | POA: Diagnosis not present

## 2013-02-16 DIAGNOSIS — M169 Osteoarthritis of hip, unspecified: Secondary | ICD-10-CM | POA: Diagnosis not present

## 2013-04-21 DIAGNOSIS — M169 Osteoarthritis of hip, unspecified: Secondary | ICD-10-CM | POA: Diagnosis not present

## 2013-04-21 DIAGNOSIS — Z6827 Body mass index (BMI) 27.0-27.9, adult: Secondary | ICD-10-CM | POA: Diagnosis not present

## 2013-04-21 DIAGNOSIS — M5137 Other intervertebral disc degeneration, lumbosacral region: Secondary | ICD-10-CM | POA: Diagnosis not present

## 2013-06-28 DIAGNOSIS — G8929 Other chronic pain: Secondary | ICD-10-CM | POA: Diagnosis not present

## 2013-06-28 DIAGNOSIS — Z6828 Body mass index (BMI) 28.0-28.9, adult: Secondary | ICD-10-CM | POA: Diagnosis not present

## 2013-08-18 DIAGNOSIS — Z6828 Body mass index (BMI) 28.0-28.9, adult: Secondary | ICD-10-CM | POA: Diagnosis not present

## 2013-08-18 DIAGNOSIS — G8929 Other chronic pain: Secondary | ICD-10-CM | POA: Diagnosis not present

## 2013-08-18 DIAGNOSIS — I10 Essential (primary) hypertension: Secondary | ICD-10-CM | POA: Diagnosis not present

## 2013-08-18 DIAGNOSIS — Z23 Encounter for immunization: Secondary | ICD-10-CM | POA: Diagnosis not present

## 2013-09-05 DIAGNOSIS — M1610 Unilateral primary osteoarthritis, unspecified hip: Secondary | ICD-10-CM | POA: Diagnosis not present

## 2013-09-12 DIAGNOSIS — I10 Essential (primary) hypertension: Secondary | ICD-10-CM | POA: Diagnosis not present

## 2013-09-12 DIAGNOSIS — Z01818 Encounter for other preprocedural examination: Secondary | ICD-10-CM | POA: Diagnosis not present

## 2013-09-12 DIAGNOSIS — G8929 Other chronic pain: Secondary | ICD-10-CM | POA: Diagnosis not present

## 2013-09-12 DIAGNOSIS — Z6828 Body mass index (BMI) 28.0-28.9, adult: Secondary | ICD-10-CM | POA: Diagnosis not present

## 2013-09-12 DIAGNOSIS — R7301 Impaired fasting glucose: Secondary | ICD-10-CM | POA: Diagnosis not present

## 2013-09-12 DIAGNOSIS — E785 Hyperlipidemia, unspecified: Secondary | ICD-10-CM | POA: Diagnosis not present

## 2013-10-02 ENCOUNTER — Other Ambulatory Visit: Payer: Self-pay | Admitting: Orthopedic Surgery

## 2013-10-09 ENCOUNTER — Encounter (HOSPITAL_COMMUNITY): Payer: Self-pay | Admitting: Pharmacy Technician

## 2013-10-09 NOTE — Pre-Procedure Instructions (Signed)
Ruth Ford  10/09/2013   Your procedure is scheduled on:  Tues, June 16 @ 10:30 AM  Report to [facility] Entrance A  at 8:30 AM.  Call this number if you have problems the morning of surgery: [phone]   Remember:   Do not eat food or drink liquids after midnight.   Take these medicines the morning of surgery with A SIP OF WATER: Pepcid(if needed) and Pain Pill(if needed)              Stop taking your Fish Oil. No Goody's,BC's,Aleve,Aspirin,Ibuprofen,or any Herbal Medications    Do not wear jewelry, make-up or nail polish.  Do not wear lotions, powders, or perfumes. You may wear deodorant.  Do not shave 48 hours prior to surgery.   Do not bring valuables to the hospital.  [facility] is not responsible                  for any belongings or valuables.               Contacts, dentures or bridgework may not be worn into surgery.  Leave suitcase in the car. After surgery it may be brought to your room.  For patients admitted to the hospital, discharge time is determined by your                treatment team.                 Special Instructions:  [place] - Preparing for Surgery  Before surgery, you can play an important role.  Because skin is not sterile, your skin needs to be as free of germs as possible.  You can reduce the number of germs on you skin by washing with CHG (chlorahexidine gluconate) soap before surgery.  CHG is an antiseptic cleaner which kills germs and bonds with the skin to continue killing germs even after washing.  Please DO NOT use if you have an allergy to CHG or antibacterial soaps.  If your skin becomes reddened/irritated stop using the CHG and inform your nurse when you arrive at Short Stay.  Do not shave (including legs and underarms) for at least 48 hours prior to the first CHG shower.  You may shave your face.  Please follow these instructions carefully:   1.  Shower with CHG Soap the night before surgery and the                                 morning of Surgery.  2.  If you choose to wash your hair, wash your hair first as usual with your       normal shampoo.  3.  After you shampoo, rinse your hair and body thoroughly to remove the                      Shampoo.  4.  Use CHG as you would any other liquid soap.  You can apply chg directly       to the skin and wash gently with scrungie or a clean washcloth.  5.  Apply the CHG Soap to your body ONLY FROM THE NECK DOWN.        Do not use on open wounds or open sores.  Avoid contact with your eyes,       ears, mouth and genitals (private parts).  Wash genitals (private parts)  with your normal soap.  6.  Wash thoroughly, paying special attention to the area where your surgery        will be performed.  7.  Thoroughly rinse your body with warm water from the neck down.  8.  DO NOT shower/wash with your normal soap after using and rinsing off       the CHG Soap.  9.  Pat yourself dry with a clean towel.            10.  Wear clean pajamas.            11.  Place clean sheets on your bed the night of your first shower and do not        sleep with pets.  Day of Surgery  Do not apply any lotions/deoderants the morning of surgery.  Please wear clean clothes to the hospital/surgery center.     Please read over the following fact sheets that you were given: Pain Booklet, Coughing and Deep Breathing, Blood Transfusion Information, MRSA Information and Surgical Site Infection Prevention

## 2013-10-10 ENCOUNTER — Encounter (HOSPITAL_COMMUNITY)
Admission: RE | Admit: 2013-10-10 | Discharge: 2013-10-10 | Disposition: A | Discharge status: Home / Self Care | Payer: Medicare Other | Source: Ambulatory Visit | Attending: Orthopedic Surgery | Admitting: Orthopedic Surgery

## 2013-10-10 ENCOUNTER — Encounter (HOSPITAL_COMMUNITY): Payer: Self-pay

## 2013-10-10 DIAGNOSIS — Z01812 Encounter for preprocedural laboratory examination: Secondary | ICD-10-CM | POA: Insufficient documentation

## 2013-10-10 HISTORY — DX: Shortness of breath: R06.02

## 2013-10-10 HISTORY — DX: Adverse effect of unspecified anesthetic, initial encounter: T41.45XA

## 2013-10-10 HISTORY — DX: Other complications of anesthesia, initial encounter: T88.59XA

## 2013-10-10 HISTORY — DX: Gastro-esophageal reflux disease without esophagitis: K21.9

## 2013-10-10 HISTORY — DX: Anxiety disorder, unspecified: F41.9

## 2013-10-10 LAB — PROTIME-INR
INR: 0.98 (ref 0.00–1.49)
Prothrombin Time: 12.8 seconds (ref 11.6–15.2)

## 2013-10-10 LAB — SURGICAL PCR SCREEN
MRSA, PCR: NEGATIVE
STAPHYLOCOCCUS AUREUS: NEGATIVE

## 2013-10-10 LAB — BASIC METABOLIC PANEL
BUN: 16 mg/dL (ref 6–23)
CALCIUM: 10.3 mg/dL (ref 8.4–10.5)
CO2: 28 mEq/L (ref 19–32)
Chloride: 105 mEq/L (ref 96–112)
Creatinine, Ser: 0.99 mg/dL (ref 0.50–1.10)
GFR calc Af Amer: 62 mL/min — ABNORMAL LOW (ref >90)
GFR, EST NON AFRICAN AMERICAN: 54 mL/min — AB (ref >90)
GLUCOSE: 98 mg/dL (ref 70–99)
Potassium: 4.3 mEq/L (ref 3.7–5.3)
SODIUM: 143 meq/L (ref 137–147)

## 2013-10-10 LAB — CBC
HCT: 44.3 % (ref 36.0–46.0)
Hemoglobin: 14.4 g/dL (ref 12.0–15.0)
MCH: 29.3 pg (ref 26.0–34.0)
MCHC: 32.5 g/dL (ref 30.0–36.0)
MCV: 90.2 fL (ref 78.0–100.0)
PLATELETS: 242 10*3/uL (ref 150–400)
RBC: 4.91 MIL/uL (ref 3.87–5.11)
RDW: 14.5 % (ref 11.5–15.5)
WBC: 6.8 10*3/uL (ref 4.0–10.5)

## 2013-10-10 LAB — APTT: aPTT: 35 seconds (ref 24–37)

## 2013-10-10 LAB — TYPE AND SCREEN
ABO/RH(D): O POS
Antibody Screen: NEGATIVE

## 2013-10-10 LAB — ABO/RH: ABO/RH(D): O POS

## 2013-10-11 ENCOUNTER — Emergency Department (HOSPITAL_COMMUNITY): Payer: Medicare Other

## 2013-10-11 ENCOUNTER — Encounter (HOSPITAL_COMMUNITY): Payer: Self-pay | Admitting: Emergency Medicine

## 2013-10-11 ENCOUNTER — Emergency Department (HOSPITAL_COMMUNITY)
Admission: EM | Admit: 2013-10-11 | Discharge: 2013-10-11 | Disposition: A | Discharge status: Home / Self Care | Payer: Medicare Other | Attending: Emergency Medicine | Admitting: Emergency Medicine

## 2013-10-11 DIAGNOSIS — W19XXXA Unspecified fall, initial encounter: Secondary | ICD-10-CM

## 2013-10-11 DIAGNOSIS — Y9289 Other specified places as the place of occurrence of the external cause: Secondary | ICD-10-CM | POA: Insufficient documentation

## 2013-10-11 DIAGNOSIS — M79609 Pain in unspecified limb: Secondary | ICD-10-CM | POA: Diagnosis not present

## 2013-10-11 DIAGNOSIS — Z79899 Other long term (current) drug therapy: Secondary | ICD-10-CM | POA: Diagnosis not present

## 2013-10-11 DIAGNOSIS — Z8739 Personal history of other diseases of the musculoskeletal system and connective tissue: Secondary | ICD-10-CM | POA: Insufficient documentation

## 2013-10-11 DIAGNOSIS — S99919A Unspecified injury of unspecified ankle, initial encounter: Principal | ICD-10-CM

## 2013-10-11 DIAGNOSIS — M25579 Pain in unspecified ankle and joints of unspecified foot: Secondary | ICD-10-CM | POA: Diagnosis not present

## 2013-10-11 DIAGNOSIS — F411 Generalized anxiety disorder: Secondary | ICD-10-CM | POA: Diagnosis not present

## 2013-10-11 DIAGNOSIS — S8990XA Unspecified injury of unspecified lower leg, initial encounter: Secondary | ICD-10-CM | POA: Insufficient documentation

## 2013-10-11 DIAGNOSIS — Z8719 Personal history of other diseases of the digestive system: Secondary | ICD-10-CM | POA: Diagnosis not present

## 2013-10-11 DIAGNOSIS — Y9301 Activity, walking, marching and hiking: Secondary | ICD-10-CM | POA: Insufficient documentation

## 2013-10-11 DIAGNOSIS — R296 Repeated falls: Secondary | ICD-10-CM | POA: Insufficient documentation

## 2013-10-11 DIAGNOSIS — Z8781 Personal history of (healed) traumatic fracture: Secondary | ICD-10-CM | POA: Diagnosis not present

## 2013-10-11 DIAGNOSIS — M7989 Other specified soft tissue disorders: Secondary | ICD-10-CM | POA: Diagnosis not present

## 2013-10-11 DIAGNOSIS — S99929A Unspecified injury of unspecified foot, initial encounter: Principal | ICD-10-CM

## 2013-10-11 NOTE — Discharge Instructions (Signed)

## 2013-10-11 NOTE — ED Provider Notes (Signed)
CSN: [id number]     Arrival date & time 10/11/13  1254 History   First MD Initiated Contact with Patient 10/11/13 1718     Chief Complaint  Patient presents with  . Fall     (Consider location/radiation/quality/duration/timing/severity/associated sxs/prior Treatment) Patient is a 80 y.o. female presenting with fall. The history is provided by the patient and a relative.  Fall This is a new problem. The current episode started today. Episode frequency: once. The problem has been resolved. Pertinent negatives include no abdominal pain, chest pain, chills, congestion, coughing, fever, headaches, nausea, neck pain, numbness, rash, vomiting or weakness. Nothing aggravates the symptoms. She has tried nothing for the symptoms.    80 yo F sp fall. Mechanical. Walking out door. Lost balance and fell. Attempted to catch self on screen door but it opened backwards. No head injury. No neck pain. No loc. Remembers entire event. Left foot felt "funny" afterwards, but denies pain there. Denies any pain at all. Ambulatory afterwards. Presenting to ED for evaluation. Patient had pre-op appointment yesterday for right hip replacement surgery next week. Want to get evaluated in ED to determine this doesn't change surgical plan.    Past Medical History  Diagnosis Date  . Arthritis   . Primary localized osteoarthrosis, lower leg, left 07/08/2012  . Distal radius fracture, left 07/08/2012  . Shortness of breath   . Anxiety     occ  . GERD (gastroesophageal reflux disease)   . Complication of anesthesia     "goes to sleep fast"   Past Surgical History  Procedure Laterality Date  . Back surgery    . Open reduction internal fixation (orif) distal radial fracture Left 07/07/2012    Procedure: OPEN REDUCTION INTERNAL FIXATION (ORIF) DISTAL RADIAL FRACTURE;  Surgeon: Eulas PostJoshua P Landau, MD;  Location: [facility] OR;  Service: Orthopedics;  Laterality: Left;  . Total hip arthroplasty Left 07/07/2012    Procedure: TOTAL HIP  ARTHROPLASTY;  Surgeon: Eulas PostJoshua P Landau, MD;  Location: [facility] OR;  Service: Orthopedics;  Laterality: Left;  . Joint replacement Left 07/07/12  . Eye surgery Bilateral     catract   History reviewed. No pertinent family history. History  Substance Use Topics  . Smoking status: Never Smoker   . Smokeless tobacco: Not on file  . Alcohol Use: No   OB History   Grav Para Term Preterm Abortions TAB SAB Ect Mult Living                 Review of Systems  Constitutional: Negative for fever and chills.  HENT: Negative for congestion and rhinorrhea.   Eyes: Negative for visual disturbance.  Respiratory: Negative for cough and shortness of breath.   Cardiovascular: Negative for chest pain and leg swelling.  Gastrointestinal: Negative for nausea, vomiting and abdominal pain.  Genitourinary: Negative for dysuria, hematuria, flank pain and difficulty urinating.  Musculoskeletal: Negative for back pain and neck pain.  Skin: Negative for color change and rash.  Neurological: Negative for dizziness, weakness, numbness and headaches.  All other systems reviewed and are negative.     Allergies  Review of patient's allergies indicates no known allergies.  Home Medications   Prior to Admission medications   Medication Sig Start Date End Date Taking? Authorizing Provider  famotidine-calcium carbonate-magnesium hydroxide (PEPCID COMPLETE) [phone] MG CHEW Chew 1 tablet by mouth daily as needed (indigestion).    Yes Historical Provider, MD  fish oil-omega-3 fatty acids 1000 MG capsule Take 1 g by mouth daily.  Yes Historical Provider, MD  hydrochlorothiazide (HYDRODIURIL) 25 MG tablet Take 25 mg by mouth daily.   Yes Historical Provider, MD  LORazepam (ATIVAN) 0.5 MG tablet Take 0.5 mg by mouth every 8 (eight) hours.   Yes Historical Provider, MD  oxyCODONE-acetaminophen (PERCOCET/ROXICET) 5-325 MG per tablet Take 1-2 tablets by mouth every 6 (six) hours as needed for severe pain.   Yes Historical  Provider, MD  sennosides-docusate sodium (SENOKOT-S) 8.6-50 MG tablet Take 1 tablet by mouth daily. 07/11/12  Yes Eulas Post, MD   BP 149/72  Pulse 82  Temp(Src) 98.4 F (36.9 C)  Resp 18  SpO2 98% Physical Exam  Nursing note and vitals reviewed. Constitutional: She is oriented to person, place, and time. She appears well-developed and well-nourished. No distress.  HENT:  Head: Normocephalic and atraumatic.  Eyes: Conjunctivae are normal. Pupils are equal, round, and reactive to light. Right eye exhibits no discharge. Left eye exhibits no discharge.  Neck: Normal range of motion. Neck supple. No tracheal deviation present.  Cardiovascular: Normal rate, regular rhythm, normal heart sounds and intact distal pulses.   Pulmonary/Chest: Effort normal and breath sounds normal. No stridor. No respiratory distress. She has no wheezes. She has no rales.  Abdominal: Soft. She exhibits no distension. There is no tenderness. There is no guarding.  Musculoskeletal: She exhibits no edema and no tenderness.       Left hip: She exhibits normal range of motion and no tenderness.       Left knee: She exhibits normal range of motion. No tenderness found.       Right ankle: She exhibits normal range of motion. No tenderness.       Left ankle: She exhibits normal range of motion, no swelling, no deformity, no laceration and normal pulse. No tenderness.       Cervical back: She exhibits no tenderness, no bony tenderness and no deformity.       Right foot: She exhibits normal range of motion, no tenderness, no bony tenderness and normal capillary refill.       Left foot: She exhibits normal range of motion, no tenderness, no bony tenderness, normal capillary refill and no deformity.  Neurological: She is alert and oriented to person, place, and time. GCS eye subscore is 4. GCS verbal subscore is 5. GCS motor subscore is 6.  MAE equally  Skin: Skin is warm and dry.  Psychiatric: She has a normal mood and  affect. Her behavior is normal.    ED Course  Procedures (including critical care time) Labs Review Labs Reviewed - No data to display  Imaging Review Dg Ankle Complete Left  10/11/2013   CLINICAL DATA:  Fall with pain and swelling  EXAM: LEFT ANKLE COMPLETE - 3+ VIEW  COMPARISON:  None.  FINDINGS: Normal alignment and no fracture.  Calcaneal spurring noted.  IMPRESSION: Negative for fracture.   Electronically Signed   By: Marlan Palau M.D.   On: 10/11/2013 14:16   Dg Foot Complete Left  10/11/2013   CLINICAL DATA:  Foot and ankle swelling post fall  EXAM: LEFT FOOT - COMPLETE 3+ VIEW  COMPARISON:  None  FINDINGS: Osseous demineralization.  Joint spaces preserved.  No acute fracture, dislocation or bone destruction.  Small plantar calcaneal spur.  Small amount of calcification is seen at the posterior LEFT lower leg likely at the region of the musculotendinous junction of the Achilles tendon.  IMPRESSION: No acute osseous abnormalities.  Question chronic calcific tendinitis of the  Achilles at the musculotendinous junction.   Electronically Signed   By: Ulyses Southward M.D.   On: 10/11/2013 14:19     EKG Interpretation None      MDM   Final diagnoses:  Fall    Fall. No pain. "strange" sensation to ankle resolved. X-rays negative. RDE08. HDS.  No syncope No head injury.  Ambulatory.  To discuss with her surgeon tomorrow about upcoming operation.  To continue home BP meds at this time (patient reportedly told by somebody to stop antihypertensives in preparation for surgery, another person told her to continue). She will further clarify with PCP/surgeon.  Patient discharged home. Return precautions given. patient in agreement with plan.     Stevie Kern, MD 10/12/13 [phone]

## 2013-10-11 NOTE — ED Notes (Addendum)
Per pt sts fall when walking out the door this am. sts left foot and ankle jut do not feel right. denis actual pain.  No obvious deformity or swelling. Pt sts a few scratches. Denies hitting head. Denies neck or back pain.

## 2013-10-13 NOTE — ED Provider Notes (Signed)
This patient was seen in conjunction with Dr. Roxy CedarMcLennan (the resident physician). The documentation accurately reflects the patients ED evaluation and management with the following clarifications.  On my exam the patient was in no distress.  She was HD stable, neurovascularly intact, moving all extremities spontaneously and in no distress.   Gerhard Munchobert Jarid Sasso, MD 10/13/13 [phone]

## 2013-10-16 MED ORDER — CEFAZOLIN SODIUM-DEXTROSE 2-3 GM-% IV SOLR
2.0000 g | INTRAVENOUS | Status: AC
  Administered 2013-10-17: 2 g via INTRAVENOUS
  Filled 2013-10-16: qty 50

## 2013-10-17 ENCOUNTER — Inpatient Hospital Stay (HOSPITAL_COMMUNITY): Payer: Medicare Other

## 2013-10-17 ENCOUNTER — Inpatient Hospital Stay (HOSPITAL_COMMUNITY): Payer: Medicare Other | Admitting: Anesthesiology

## 2013-10-17 ENCOUNTER — Encounter (HOSPITAL_COMMUNITY): Payer: Medicare Other | Admitting: Vascular Surgery

## 2013-10-17 ENCOUNTER — Inpatient Hospital Stay (HOSPITAL_COMMUNITY)
Admission: RE | Admit: 2013-10-17 | Discharge: 2013-10-20 | DRG: 470 | Disposition: A | Discharge status: Skilled Nursing Facility | Payer: Medicare Other | Source: Ambulatory Visit | Attending: Orthopedic Surgery | Admitting: Orthopedic Surgery

## 2013-10-17 ENCOUNTER — Encounter (HOSPITAL_COMMUNITY): Payer: Self-pay | Admitting: *Deleted

## 2013-10-17 ENCOUNTER — Encounter (HOSPITAL_COMMUNITY)
Admission: RE | Disposition: A | Discharge status: Skilled Nursing Facility | Payer: Self-pay | Source: Ambulatory Visit | Attending: Orthopedic Surgery

## 2013-10-17 DIAGNOSIS — R262 Difficulty in walking, not elsewhere classified: Secondary | ICD-10-CM | POA: Diagnosis not present

## 2013-10-17 DIAGNOSIS — Z471 Aftercare following joint replacement surgery: Secondary | ICD-10-CM | POA: Diagnosis not present

## 2013-10-17 DIAGNOSIS — M1710 Unilateral primary osteoarthritis, unspecified knee: Secondary | ICD-10-CM | POA: Diagnosis present

## 2013-10-17 DIAGNOSIS — S79919A Unspecified injury of unspecified hip, initial encounter: Secondary | ICD-10-CM | POA: Diagnosis not present

## 2013-10-17 DIAGNOSIS — M169 Osteoarthritis of hip, unspecified: Principal | ICD-10-CM | POA: Diagnosis present

## 2013-10-17 DIAGNOSIS — I10 Essential (primary) hypertension: Secondary | ICD-10-CM | POA: Diagnosis not present

## 2013-10-17 DIAGNOSIS — K219 Gastro-esophageal reflux disease without esophagitis: Secondary | ICD-10-CM | POA: Diagnosis not present

## 2013-10-17 DIAGNOSIS — Z96649 Presence of unspecified artificial hip joint: Secondary | ICD-10-CM | POA: Diagnosis not present

## 2013-10-17 DIAGNOSIS — Z79899 Other long term (current) drug therapy: Secondary | ICD-10-CM | POA: Diagnosis not present

## 2013-10-17 DIAGNOSIS — R279 Unspecified lack of coordination: Secondary | ICD-10-CM | POA: Diagnosis not present

## 2013-10-17 DIAGNOSIS — M6281 Muscle weakness (generalized): Secondary | ICD-10-CM | POA: Diagnosis not present

## 2013-10-17 DIAGNOSIS — M1610 Unilateral primary osteoarthritis, unspecified hip: Principal | ICD-10-CM | POA: Diagnosis present

## 2013-10-17 DIAGNOSIS — F411 Generalized anxiety disorder: Secondary | ICD-10-CM | POA: Diagnosis present

## 2013-10-17 DIAGNOSIS — M25559 Pain in unspecified hip: Secondary | ICD-10-CM | POA: Diagnosis not present

## 2013-10-17 DIAGNOSIS — K5900 Constipation, unspecified: Secondary | ICD-10-CM | POA: Diagnosis not present

## 2013-10-17 DIAGNOSIS — Z7901 Long term (current) use of anticoagulants: Secondary | ICD-10-CM

## 2013-10-17 DIAGNOSIS — M1611 Unilateral primary osteoarthritis, right hip: Secondary | ICD-10-CM | POA: Diagnosis present

## 2013-10-17 HISTORY — PX: TOTAL HIP ARTHROPLASTY: SHX124

## 2013-10-17 HISTORY — DX: Unilateral primary osteoarthritis, right hip: M16.11

## 2013-10-17 SURGERY — ARTHROPLASTY, HIP, TOTAL,POSTERIOR APPROACH
Anesthesia: Monitor Anesthesia Care | Site: Hip | Laterality: Right

## 2013-10-17 MED ORDER — FAMOTIDINE 10 MG PO TABS
10.0000 mg | ORAL_TABLET | Freq: Every day | ORAL | Status: DC | PRN
  Filled 2013-10-17: qty 1

## 2013-10-17 MED ORDER — LIDOCAINE HCL (CARDIAC) 20 MG/ML IV SOLN
INTRAVENOUS | Status: AC
  Filled 2013-10-17: qty 5

## 2013-10-17 MED ORDER — OXYCODONE HCL 5 MG PO TABS
ORAL_TABLET | ORAL | Status: AC
  Administered 2013-10-17: 10 mg
  Filled 2013-10-17: qty 1

## 2013-10-17 MED ORDER — BUPIVACAINE IN DEXTROSE 0.75-8.25 % IT SOLN
INTRATHECAL | Status: DC | PRN
  Administered 2013-10-17: 1.8 mL via INTRATHECAL

## 2013-10-17 MED ORDER — FENTANYL CITRATE 0.05 MG/ML IJ SOLN
INTRAMUSCULAR | Status: AC
  Filled 2013-10-17: qty 5

## 2013-10-17 MED ORDER — PHENYLEPHRINE HCL 10 MG/ML IJ SOLN
10.0000 mg | INTRAVENOUS | Status: DC | PRN
  Administered 2013-10-17: 20 ug/min via INTRAVENOUS

## 2013-10-17 MED ORDER — DEXTROSE 5 % IV SOLN
INTRAVENOUS | Status: DC | PRN
  Administered 2013-10-17: 11:00:00 via INTRAVENOUS

## 2013-10-17 MED ORDER — KETOROLAC TROMETHAMINE 30 MG/ML IJ SOLN
INTRAMUSCULAR | Status: AC
  Filled 2013-10-17: qty 1

## 2013-10-17 MED ORDER — OXYCODONE HCL 5 MG PO TABS: 5.0000 mg | ORAL_TABLET | Freq: Once | ORAL | Status: DC | PRN

## 2013-10-17 MED ORDER — HYDROMORPHONE HCL PF 1 MG/ML IJ SOLN
INTRAMUSCULAR | Status: AC
  Administered 2013-10-17: 0.5 mg via INTRAVENOUS
  Filled 2013-10-17: qty 1

## 2013-10-17 MED ORDER — BISACODYL 10 MG RE SUPP: 10.0000 mg | Freq: Every day | RECTAL | Status: DC | PRN

## 2013-10-17 MED ORDER — POTASSIUM CHLORIDE IN NACL 20-0.45 MEQ/L-% IV SOLN
INTRAVENOUS | Status: DC
  Administered 2013-10-18 – 2013-10-19 (×3): via INTRAVENOUS
  Filled 2013-10-17 (×8): qty 1000

## 2013-10-17 MED ORDER — ACETAMINOPHEN 160 MG/5ML PO SOLN
325.0000 mg | ORAL | Status: DC | PRN
  Filled 2013-10-17: qty 20.3

## 2013-10-17 MED ORDER — ONDANSETRON HCL 4 MG/2ML IJ SOLN
INTRAMUSCULAR | Status: DC | PRN
  Administered 2013-10-17: 4 mg via INTRAVENOUS

## 2013-10-17 MED ORDER — ONDANSETRON HCL 4 MG/2ML IJ SOLN: 4.0000 mg | Freq: Once | INTRAMUSCULAR | Status: DC | PRN

## 2013-10-17 MED ORDER — PROPOFOL INFUSION 10 MG/ML OPTIME
INTRAVENOUS | Status: DC | PRN
  Administered 2013-10-17: 25 ug/kg/min via INTRAVENOUS

## 2013-10-17 MED ORDER — HYDROMORPHONE HCL PF 1 MG/ML IJ SOLN
0.5000 mg | INTRAMUSCULAR | Status: DC | PRN
  Administered 2013-10-17 (×2): 0.5 mg via INTRAVENOUS

## 2013-10-17 MED ORDER — ALUM & MAG HYDROXIDE-SIMETH 200-200-20 MG/5ML PO SUSP
30.0000 mL | ORAL | Status: DC | PRN
  Administered 2013-10-17: 30 mL via ORAL
  Filled 2013-10-17: qty 30

## 2013-10-17 MED ORDER — ONDANSETRON HCL 4 MG/2ML IJ SOLN
INTRAMUSCULAR | Status: AC
  Filled 2013-10-17: qty 2

## 2013-10-17 MED ORDER — PROPOFOL 10 MG/ML IV BOLUS
INTRAVENOUS | Status: AC
  Filled 2013-10-17: qty 20

## 2013-10-17 MED ORDER — METOCLOPRAMIDE HCL 10 MG PO TABS: 5.0000 mg | ORAL_TABLET | Freq: Three times a day (TID) | ORAL | Status: DC | PRN

## 2013-10-17 MED ORDER — SODIUM CHLORIDE 0.9 % IR SOLN
Status: DC | PRN
  Administered 2013-10-17: 1000 mL

## 2013-10-17 MED ORDER — MIDAZOLAM HCL 2 MG/2ML IJ SOLN
INTRAMUSCULAR | Status: AC
  Filled 2013-10-17: qty 2

## 2013-10-17 MED ORDER — ONDANSETRON HCL 4 MG/2ML IJ SOLN
4.0000 mg | Freq: Four times a day (QID) | INTRAMUSCULAR | Status: DC | PRN
  Administered 2013-10-18: 4 mg via INTRAVENOUS
  Filled 2013-10-17: qty 2

## 2013-10-17 MED ORDER — LORAZEPAM 0.5 MG PO TABS
0.5000 mg | ORAL_TABLET | Freq: Three times a day (TID) | ORAL | Status: DC
  Administered 2013-10-17 – 2013-10-20 (×9): 0.5 mg via ORAL
  Filled 2013-10-17 (×9): qty 1

## 2013-10-17 MED ORDER — KETOROLAC TROMETHAMINE 15 MG/ML IJ SOLN
7.5000 mg | Freq: Four times a day (QID) | INTRAMUSCULAR | Status: AC
  Administered 2013-10-17 – 2013-10-18 (×4): 7.5 mg via INTRAVENOUS
  Filled 2013-10-17 (×4): qty 1

## 2013-10-17 MED ORDER — HYDROCHLOROTHIAZIDE 25 MG PO TABS
25.0000 mg | ORAL_TABLET | Freq: Every day | ORAL | Status: DC
  Administered 2013-10-18 – 2013-10-20 (×3): 25 mg via ORAL
  Filled 2013-10-17 (×3): qty 1

## 2013-10-17 MED ORDER — METHOCARBAMOL 500 MG PO TABS
500.0000 mg | ORAL_TABLET | Freq: Four times a day (QID) | ORAL | Status: DC | PRN
  Administered 2013-10-17 – 2013-10-20 (×6): 500 mg via ORAL
  Filled 2013-10-17 (×7): qty 1

## 2013-10-17 MED ORDER — HYDROMORPHONE HCL PF 1 MG/ML IJ SOLN
0.5000 mg | INTRAMUSCULAR | Status: DC | PRN
  Administered 2013-10-17 – 2013-10-20 (×3): 0.5 mg via INTRAVENOUS
  Filled 2013-10-17 (×3): qty 1

## 2013-10-17 MED ORDER — ACETAMINOPHEN 650 MG RE SUPP: 650.0000 mg | Freq: Four times a day (QID) | RECTAL | Status: DC | PRN

## 2013-10-17 MED ORDER — CEFAZOLIN SODIUM-DEXTROSE 2-3 GM-% IV SOLR
2.0000 g | Freq: Four times a day (QID) | INTRAVENOUS | Status: AC
  Administered 2013-10-17 – 2013-10-18 (×2): 2 g via INTRAVENOUS
  Filled 2013-10-17 (×3): qty 50

## 2013-10-17 MED ORDER — LACTATED RINGERS IV SOLN
INTRAVENOUS | Status: DC
  Administered 2013-10-17: 09:00:00 via INTRAVENOUS

## 2013-10-17 MED ORDER — POLYETHYLENE GLYCOL 3350 17 G PO PACK
17.0000 g | PACK | Freq: Every day | ORAL | Status: DC | PRN
  Administered 2013-10-19 – 2013-10-20 (×2): 17 g via ORAL
  Filled 2013-10-17 (×2): qty 1

## 2013-10-17 MED ORDER — OXYCODONE-ACETAMINOPHEN 10-325 MG PO TABS: 1.0000 | ORAL_TABLET | Freq: Four times a day (QID) | ORAL | Status: DC | PRN

## 2013-10-17 MED ORDER — DIPHENHYDRAMINE HCL 12.5 MG/5ML PO ELIX: 12.5000 mg | ORAL_SOLUTION | ORAL | Status: DC | PRN

## 2013-10-17 MED ORDER — KETOROLAC TROMETHAMINE 30 MG/ML IJ SOLN: 15.0000 mg | Freq: Once | INTRAMUSCULAR | Status: DC | PRN

## 2013-10-17 MED ORDER — MENTHOL 3 MG MT LOZG: 1.0000 | LOZENGE | OROMUCOSAL | Status: DC | PRN

## 2013-10-17 MED ORDER — LACTATED RINGERS IV SOLN
INTRAVENOUS | Status: DC | PRN
  Administered 2013-10-17 (×2): via INTRAVENOUS

## 2013-10-17 MED ORDER — RIVAROXABAN 10 MG PO TABS: 10.0000 mg | ORAL_TABLET | Freq: Every day | ORAL | Status: DC

## 2013-10-17 MED ORDER — PHENOL 1.4 % MT LIQD: 1.0000 | OROMUCOSAL | Status: DC | PRN

## 2013-10-17 MED ORDER — MIDAZOLAM HCL 5 MG/5ML IJ SOLN
INTRAMUSCULAR | Status: DC | PRN
  Administered 2013-10-17 (×3): 0.5 mg via INTRAVENOUS

## 2013-10-17 MED ORDER — ACETAMINOPHEN 325 MG PO TABS: 325.0000 mg | ORAL_TABLET | ORAL | Status: DC | PRN

## 2013-10-17 MED ORDER — ONDANSETRON HCL 4 MG PO TABS: 4.0000 mg | ORAL_TABLET | Freq: Four times a day (QID) | ORAL | Status: DC | PRN

## 2013-10-17 MED ORDER — BACLOFEN 10 MG PO TABS: 10.0000 mg | ORAL_TABLET | Freq: Three times a day (TID) | ORAL | Status: DC

## 2013-10-17 MED ORDER — MAGNESIUM CITRATE PO SOLN: 1.0000 | Freq: Once | ORAL | Status: AC | PRN

## 2013-10-17 MED ORDER — FENTANYL CITRATE 0.05 MG/ML IJ SOLN
INTRAMUSCULAR | Status: DC | PRN
  Administered 2013-10-17 (×3): 25 ug via INTRAVENOUS

## 2013-10-17 MED ORDER — OXYCODONE HCL 5 MG PO TABS
ORAL_TABLET | ORAL | Status: AC
  Administered 2013-10-17: 10 mg via ORAL
  Filled 2013-10-17: qty 1

## 2013-10-17 MED ORDER — DEXTROSE 5 % IV SOLN
500.0000 mg | Freq: Four times a day (QID) | INTRAVENOUS | Status: DC | PRN
  Filled 2013-10-17: qty 5

## 2013-10-17 MED ORDER — LIDOCAINE HCL (CARDIAC) 20 MG/ML IV SOLN
INTRAVENOUS | Status: DC | PRN
  Administered 2013-10-17: 50 mg via INTRAVENOUS

## 2013-10-17 MED ORDER — ACETAMINOPHEN 325 MG PO TABS
650.0000 mg | ORAL_TABLET | Freq: Four times a day (QID) | ORAL | Status: DC | PRN
  Administered 2013-10-20: 650 mg via ORAL
  Filled 2013-10-17: qty 2

## 2013-10-17 MED ORDER — ONDANSETRON HCL 4 MG PO TABS: 4.0000 mg | ORAL_TABLET | Freq: Three times a day (TID) | ORAL | Status: DC | PRN

## 2013-10-17 MED ORDER — FAMOTIDINE-CA CARB-MAG HYDROX 10-800-165 MG PO CHEW: 1.0000 | CHEWABLE_TABLET | Freq: Every day | ORAL | Status: DC | PRN

## 2013-10-17 MED ORDER — DOCUSATE SODIUM 100 MG PO CAPS
100.0000 mg | ORAL_CAPSULE | Freq: Two times a day (BID) | ORAL | Status: DC
  Administered 2013-10-17 – 2013-10-20 (×6): 100 mg via ORAL
  Filled 2013-10-17 (×8): qty 1

## 2013-10-17 MED ORDER — METOCLOPRAMIDE HCL 5 MG/ML IJ SOLN: 5.0000 mg | Freq: Three times a day (TID) | INTRAMUSCULAR | Status: DC | PRN

## 2013-10-17 MED ORDER — DEXAMETHASONE SODIUM PHOSPHATE 10 MG/ML IJ SOLN
10.0000 mg | Freq: Three times a day (TID) | INTRAMUSCULAR | Status: AC
  Filled 2013-10-17 (×3): qty 1

## 2013-10-17 MED ORDER — OXYCODONE HCL 5 MG PO TABS
5.0000 mg | ORAL_TABLET | ORAL | Status: DC | PRN
  Administered 2013-10-17 – 2013-10-20 (×16): 10 mg via ORAL
  Filled 2013-10-17 (×15): qty 2

## 2013-10-17 MED ORDER — RIVAROXABAN 10 MG PO TABS
10.0000 mg | ORAL_TABLET | Freq: Every day | ORAL | Status: DC
  Administered 2013-10-18 – 2013-10-20 (×3): 10 mg via ORAL
  Filled 2013-10-17 (×4): qty 1

## 2013-10-17 MED ORDER — HYDROMORPHONE HCL PF 1 MG/ML IJ SOLN
0.2500 mg | INTRAMUSCULAR | Status: DC | PRN
  Administered 2013-10-17 (×4): 0.5 mg via INTRAVENOUS

## 2013-10-17 MED ORDER — DEXAMETHASONE 6 MG PO TABS
10.0000 mg | ORAL_TABLET | Freq: Three times a day (TID) | ORAL | Status: AC
  Administered 2013-10-17 – 2013-10-18 (×3): 10 mg via ORAL
  Filled 2013-10-17 (×3): qty 1

## 2013-10-17 MED ORDER — SENNA 8.6 MG PO TABS
1.0000 | ORAL_TABLET | Freq: Two times a day (BID) | ORAL | Status: DC
  Administered 2013-10-17 – 2013-10-20 (×6): 8.6 mg via ORAL
  Filled 2013-10-17 (×7): qty 1

## 2013-10-17 MED ORDER — OXYCODONE HCL 5 MG/5ML PO SOLN: 5.0000 mg | Freq: Once | ORAL | Status: DC | PRN

## 2013-10-17 SURGICAL SUPPLY — 64 items
BENZOIN TINCTURE PRP APPL 2/3 (GAUZE/BANDAGES/DRESSINGS) ×3 IMPLANT
BLADE SAW SAG 73X25 THK (BLADE) ×2
BLADE SAW SGTL 73X25 THK (BLADE) ×1 IMPLANT
BRUSH FEMORAL CANAL (MISCELLANEOUS) IMPLANT
CAPT HIP PF MOP ×3 IMPLANT
CLOSURE STERI-STRIP 1/2X4 (GAUZE/BANDAGES/DRESSINGS) ×1
CLSR STERI-STRIP ANTIMIC 1/2X4 (GAUZE/BANDAGES/DRESSINGS) ×2 IMPLANT
COVER SURGICAL LIGHT HANDLE (MISCELLANEOUS) ×3 IMPLANT
DRAPE INCISE IOBAN 66X45 STRL (DRAPES) IMPLANT
DRAPE ORTHO SPLIT 77X108 STRL (DRAPES) ×4
DRAPE PROXIMA HALF (DRAPES) ×3 IMPLANT
DRAPE SURG ORHT 6 SPLT 77X108 (DRAPES) ×2 IMPLANT
DRAPE U-SHAPE 47X51 STRL (DRAPES) ×3 IMPLANT
DRILL BIT 5/64 (BIT) ×3 IMPLANT
DRSG MEPILEX BORDER 4X12 (GAUZE/BANDAGES/DRESSINGS) IMPLANT
DRSG MEPILEX BORDER 4X8 (GAUZE/BANDAGES/DRESSINGS) ×3 IMPLANT
DRSG PAD ABDOMINAL 8X10 ST (GAUZE/BANDAGES/DRESSINGS) IMPLANT
DURAPREP 26ML APPLICATOR (WOUND CARE) ×3 IMPLANT
ELECT CAUTERY BLADE 6.4 (BLADE) ×3 IMPLANT
ELECT REM PT RETURN 9FT ADLT (ELECTROSURGICAL) ×3
ELECTRODE REM PT RTRN 9FT ADLT (ELECTROSURGICAL) ×1 IMPLANT
GLOVE BIOGEL PI IND STRL 6.5 (GLOVE) ×1 IMPLANT
GLOVE BIOGEL PI IND STRL 8 (GLOVE) ×1 IMPLANT
GLOVE BIOGEL PI INDICATOR 6.5 (GLOVE) ×2
GLOVE BIOGEL PI INDICATOR 8 (GLOVE) ×2
GLOVE BIOGEL PI ORTHO PRO SZ8 (GLOVE) ×2
GLOVE ECLIPSE 6.5 STRL STRAW (GLOVE) ×9 IMPLANT
GLOVE ECLIPSE 7.5 STRL STRAW (GLOVE) ×9 IMPLANT
GLOVE ORTHO TXT STRL SZ7.5 (GLOVE) ×3 IMPLANT
GLOVE PI ORTHO PRO STRL SZ8 (GLOVE) ×1 IMPLANT
GLOVE SURG ORTHO 8.0 STRL STRW (GLOVE) ×3 IMPLANT
GOWN STRL REUS W/ TWL LRG LVL3 (GOWN DISPOSABLE) ×1 IMPLANT
GOWN STRL REUS W/TWL LRG LVL3 (GOWN DISPOSABLE) ×2
HANDPIECE INTERPULSE COAX TIP (DISPOSABLE)
HOOD PEEL AWAY FACE SHEILD DIS (HOOD) ×6 IMPLANT
KIT BASIN OR (CUSTOM PROCEDURE TRAY) ×3 IMPLANT
KIT ROOM TURNOVER OR (KITS) ×3 IMPLANT
MANIFOLD NEPTUNE II (INSTRUMENTS) ×3 IMPLANT
NEEDLE HYPO 25GX1X1/2 BEV (NEEDLE) ×3 IMPLANT
NS IRRIG 1000ML POUR BTL (IV SOLUTION) ×3 IMPLANT
PACK TOTAL JOINT (CUSTOM PROCEDURE TRAY) ×3 IMPLANT
PAD ARMBOARD 7.5X6 YLW CONV (MISCELLANEOUS) ×6 IMPLANT
PILLOW ABDUCTION HIP (SOFTGOODS) ×3 IMPLANT
PRESSURIZER FEMORAL UNIV (MISCELLANEOUS) IMPLANT
RETRIEVER SUT HEWSON (MISCELLANEOUS) ×3 IMPLANT
SET HNDPC FAN SPRY TIP SCT (DISPOSABLE) IMPLANT
SPONGE GAUZE 4X4 12PLY (GAUZE/BANDAGES/DRESSINGS) ×3 IMPLANT
SPONGE LAP 4X18 X RAY DECT (DISPOSABLE) IMPLANT
SUCTION FRAZIER TIP 10 FR DISP (SUCTIONS) ×6 IMPLANT
SUT FIBERWIRE #2 38 REV NDL BL (SUTURE) ×6
SUT MNCRL AB 4-0 PS2 18 (SUTURE) ×3 IMPLANT
SUT VIC AB 0 CT1 27 (SUTURE) ×4
SUT VIC AB 0 CT1 27XBRD ANBCTR (SUTURE) ×2 IMPLANT
SUT VIC AB 2-0 CT1 27 (SUTURE) ×2
SUT VIC AB 2-0 CT1 TAPERPNT 27 (SUTURE) ×1 IMPLANT
SUT VIC AB 3-0 SH 8-18 (SUTURE) ×3 IMPLANT
SUTURE FIBERWR#2 38 REV NDL BL (SUTURE) ×2 IMPLANT
SYR CONTROL 10ML LL (SYRINGE) ×3 IMPLANT
TOWEL OR 17X24 6PK STRL BLUE (TOWEL DISPOSABLE) ×3 IMPLANT
TOWEL OR 17X26 10 PK STRL BLUE (TOWEL DISPOSABLE) ×3 IMPLANT
TOWER CARTRIDGE SMART MIX (DISPOSABLE) IMPLANT
TRAY FOLEY CATH 14FR (SET/KITS/TRAYS/PACK) IMPLANT
TUBING BULK SUCTION (MISCELLANEOUS) ×3 IMPLANT
WATER STERILE IRR 1000ML POUR (IV SOLUTION) ×6 IMPLANT

## 2013-10-17 NOTE — Op Note (Signed)
10/17/2013  1:38 PM  PATIENT:  Ruth Ford   MRN: [id number]  PRE-OPERATIVE DIAGNOSIS:   OA RIGHT HIP  POST-OPERATIVE DIAGNOSIS:  OA RIGHT HIP  PROCEDURE:  Procedure(s): TOTAL HIP ARTHROPLASTY  PREOPERATIVE INDICATIONS:    Ruth Ford is an 80 y.o. female who has a diagnosis of Osteoarthritis of right hip and elected for surgical management after failing conservative treatment.  The risks benefits and alternatives were discussed with the patient including but not limited to the risks of nonoperative treatment, versus surgical intervention including infection, bleeding, nerve injury, periprosthetic fracture, the need for revision surgery, dislocation, leg length discrepancy, blood clots, cardiopulmonary complications, morbidity, mortality, among others, and they were willing to proceed.     OPERATIVE REPORT     SURGEON:  Marchia Bond, MD    ASSISTANT:  Joya Gaskins, OPA-C  (Present throughout the entire procedure,  necessary for completion of procedure in a timely manner, assisting with retraction, instrumentation, and closure)     ANESTHESIA:  General    COMPLICATIONS:  None.     COMPONENTS:  [employer] fit femur size 5 with a 36 mm +1.5 head ball and a gription acetabular shell size 52 with a 10 degree lipped polyethylene liner    PROCEDURE IN DETAIL:   The patient was met in the holding area and  identified.  The appropriate hip was identified and marked at the operative site.  The patient was then transported to the OR  and  placed under general anesthesia.  At that point, the patient was  placed in the lateral decubitus position with the operative side up and  secured to the operating room table and all bony prominences padded.     The operative lower extremity was prepped from the iliac crest to the distal leg.  Sterile draping was performed.  Time out was performed prior to incision.      A routine posterolateral approach was utilized via sharp dissection   carried down to the subcutaneous tissue.  Gross bleeders were Bovie coagulated.  The iliotibial band was identified and incised along the length of the skin incision.  Self-retaining retractors were  inserted.  With the hip internally rotated, the short external rotators  were identified. The piriformis and capsule was tagged with FiberWire, and the hip capsule released in a T-type fashion.  The femoral neck was exposed, and I resected the femoral neck using the appropriate jig. This was performed at approximately a thumb's breadth above the lesser trochanter. I had adequate access to the acetabulum, and did not have to recut the neck on this side, and so I suspect that this accounted for the difference in that I did not need to use a +5 neck on the femur as compared to the other side.    I then exposed the deep acetabulum, cleared out any tissue including the ligamentum teres.  A wing retractor was placed.  After adequate visualization, I excised the labrum, and then sequentially reamed.  I placed the trial acetabulum, which seated nicely, and then impacted the real cup into place.  Appropriate version and inclination was confirmed clinically matching their bony anatomy, and also with the use of the jig.  A trial polyethylene liner was placed and the wing retractor removed.    I then prepared the proximal femur using the cookie-cutter, the lateralizing reamer, and then sequentially reamed and broached.  A trial broach, neck, and head was utilized, and I reduced the hip and it  was found to have excellent stability with functional range of motion. The trial components were then removed, and the real polyethylene liner was placed with the lip directed posteriorly.  I then impacted the real femoral prosthesis into place into the appropriate version, slightly anteverted to the normal anatomy, and I impacted the real head ball into place. The hip was then reduced and taken through functional range of motion  and found to have excellent stability. Leg lengths were restored.  I then used a 2 mm drill bits to pass the FiberWire suture from the capsule and piriformis through the greater trochanter, and secured this. Excellent posterior capsular repair was achieved. I also closed the T in the capsule.  I then irrigated the hip copiously again with pulse lavage, and repaired the fascia with Vicryl, followed by Vicryl for the subcutaneous tissue, Monocryl for the skin, Steri-Strips and sterile gauze. The wounds were injected. The patient was then awakened and returned to PACU in stable and satisfactory condition. There were no complications.  Marchia Bond, MD Orthopedic Surgeon [phone]   10/17/2013 1:38 PM

## 2013-10-17 NOTE — Progress Notes (Signed)
Utilization review completed.  

## 2013-10-17 NOTE — Plan of Care (Signed)
Problem: Consults Goal: Diagnosis- Total Joint Replacement Primary Total Hip Right     

## 2013-10-17 NOTE — Anesthesia Preprocedure Evaluation (Addendum)
Anesthesia Evaluation  Patient identified by MRN, date of birth, ID band Patient awake    Reviewed: Allergy & Precautions, H&P , NPO status , Patient's Chart, lab work & pertinent test results  History of Anesthesia Complications Negative for: history of anesthetic complications  Airway Mallampati: II TM Distance: >3 FB Neck ROM: Full    Dental  (+) Partial Upper, Teeth Intact   Pulmonary neg sleep apnea, neg COPD breath sounds clear to auscultation        Cardiovascular negative cardio ROS  Rhythm:Regular     Neuro/Psych PSYCHIATRIC DISORDERS Anxiety negative neurological ROS     GI/Hepatic Neg liver ROS, GERD-  Controlled and Medicated,  Endo/Other  negative endocrine ROS  Renal/GU negative Renal ROS     Musculoskeletal   Abdominal   Peds  Hematology negative hematology ROS (+)   Anesthesia Other Findings   Reproductive/Obstetrics                           Anesthesia Physical Anesthesia Plan  ASA: II  Anesthesia Plan: MAC and Spinal   Post-op Pain Management:    Induction:   Airway Management Planned: Natural Airway  Additional Equipment: None  Intra-op Plan:   Post-operative Plan:   Informed Consent: I have reviewed the patients History and Physical, chart, labs and discussed the procedure including the risks, benefits and alternatives for the proposed anesthesia with the patient or authorized representative who has indicated his/her understanding and acceptance.   Dental advisory given  Plan Discussed with: CRNA and Surgeon  Anesthesia Plan Comments:         Anesthesia Quick Evaluation

## 2013-10-17 NOTE — Transfer of Care (Signed)
Immediate Anesthesia Transfer of Care Note  Patient: Ruth Ford  Procedure(s) Performed: Procedure(s): TOTAL HIP ARTHROPLASTY (Right)  Patient Location: PACU  Anesthesia Type:Spinal  Level of Consciousness: awake, alert , oriented and patient cooperative  Airway & Oxygen Therapy: Patient Spontanous Breathing and Patient connected to nasal cannula oxygen  Post-op Assessment: Report given to PACU RN and Post -op Vital signs reviewed and stable  Post vital signs: Reviewed and stable  Complications: No apparent anesthesia complications

## 2013-10-17 NOTE — Anesthesia Procedure Notes (Addendum)
Spinal  Patient location during procedure: pre-op Start time: 10/17/2013 10:51 AM End time: 10/17/2013 10:57 AM Staffing Anesthesiologist: MOSER, CHRIS Preanesthetic Checklist Completed: patient identified, surgical consent, pre-op evaluation, timeout performed, IV checked, risks and benefits discussed and monitors and equipment checked Spinal Block Patient position: sitting Prep: site prepped and draped and DuraPrep Patient monitoring: heart rate, cardiac monitor, continuous pulse ox and blood pressure Approach: midline Location: L3-4 Injection technique: single-shot Needle Needle type: Pencan  Needle gauge: 24 G Needle length: 10 cm Assessment Sensory level: T8

## 2013-10-17 NOTE — H&P (Signed)
PREOPERATIVE H&P  Chief Complaint: OA RIGHT HIP  HPI: Ruth Ford is a 80 y.o. female who presents for preoperative history and physical with a diagnosis of OA RIGHT HIP. Symptoms are rated as moderate to severe, and have been worsening.  This is significantly impairing activities of daily living.  She has elected for surgical management. She had her left hip replaced last year, and did very well, and wishes for the same procedure. She has failed conservative measures including anti-inflammatories, due to reflux disease, and has also had use of assistive walking devices, declined injections.  Past Medical History  Diagnosis Date  . Arthritis   . Primary localized osteoarthrosis, lower leg, left 07/08/2012  . Distal radius fracture, left 07/08/2012  . Shortness of breath   . Anxiety     occ  . GERD (gastroesophageal reflux disease)   . Complication of anesthesia     "goes to sleep fast"   Past Surgical History  Procedure Laterality Date  . Back surgery    . Open reduction internal fixation (orif) distal radial fracture Left 07/07/2012    Procedure: OPEN REDUCTION INTERNAL FIXATION (ORIF) DISTAL RADIAL FRACTURE;  Surgeon: Eulas PostJoshua P Landau, MD;  Location: [facility] OR;  Service: Orthopedics;  Laterality: Left;  . Total hip arthroplasty Left 07/07/2012    Procedure: TOTAL HIP ARTHROPLASTY;  Surgeon: Eulas PostJoshua P Landau, MD;  Location: [facility] OR;  Service: Orthopedics;  Laterality: Left;  . Joint replacement Left 07/07/12  . Eye surgery Bilateral     catract   History   Social History  . Marital Status: Divorced    Spouse Name: N/A    Number of Children: N/A  . Years of Education: N/A   Social History Main Topics  . Smoking status: Never Smoker   . Smokeless tobacco: None  . Alcohol Use: No  . Drug Use: No  . Sexual Activity: None   Other Topics Concern  . None   Social History Narrative  . None   History reviewed. No pertinent family history. No Known Allergies Prior to Admission medications    Medication Sig Start Date End Date Taking? Authorizing Provider  famotidine-calcium carbonate-magnesium hydroxide (PEPCID COMPLETE) 10-800-165 MG CHEW Chew 1 tablet by mouth daily as needed (indigestion).    Yes Historical Provider, MD  fish oil-omega-3 fatty acids 1000 MG capsule Take 1 g by mouth daily.    Yes Historical Provider, MD  hydrochlorothiazide (HYDRODIURIL) 25 MG tablet Take 25 mg by mouth daily.   Yes Historical Provider, MD  LORazepam (ATIVAN) 0.5 MG tablet Take 0.5 mg by mouth every 8 (eight) hours.   Yes Historical Provider, MD  oxyCODONE-acetaminophen (PERCOCET/ROXICET) 5-325 MG per tablet Take 1-2 tablets by mouth every 6 (six) hours as needed for severe pain.   Yes Historical Provider, MD  sennosides-docusate sodium (SENOKOT-S) 8.6-50 MG tablet Take 1 tablet by mouth daily. 07/11/12  Yes Eulas PostJoshua P Landau, MD     Positive ROS: All other systems have been reviewed and were otherwise negative with the exception of those mentioned in the HPI and as above.  Physical Exam: General: Alert, no acute distress Cardiovascular: No pedal edema Respiratory: No cyanosis, no use of accessory musculature GI: No organomegaly, abdomen is soft and non-tender Skin: No lesions in the area of chief complaint Neurologic: Sensation intact distally Psychiatric: Patient is competent for consent with normal mood and affect Lymphatic: No axillary or cervical lymphadenopathy  MUSCULOSKELETAL: Right hip has limited motion, 0-90 at most, with no internal or  external rotation severe pain.  X-rays demonstrate advanced right hip osteoarthritis with bone-on-bone changes.   Assessment: OA RIGHT HIP  Plan: Plan for Procedure(s): TOTAL HIP ARTHROPLASTY  The risks benefits and alternatives were discussed with the patient including but not limited to the risks of nonoperative treatment, versus surgical intervention including infection, bleeding, nerve injury, periprosthetic fracture, the need for  revision surgery, dislocation, leg length discrepancy, blood clots, cardiopulmonary complications, morbidity, mortality, among others, and they were willing to proceed.     Eulas PostLANDAU,JOSHUA P, MD Cell [phone]   10/17/2013 10:00 AM

## 2013-10-17 NOTE — Anesthesia Postprocedure Evaluation (Signed)
  Anesthesia Post-op Note  Patient: Ruth Ford  Procedure(s) Performed: Procedure(s): TOTAL HIP ARTHROPLASTY (Right)  Patient Location: PACU  Anesthesia Type:Spinal  Level of Consciousness: awake, alert , oriented and patient cooperative  Airway and Oxygen Therapy: Patient Spontanous Breathing and Patient connected to nasal cannula oxygen  Post-op Pain: mild  Post-op Assessment: Post-op Vital signs reviewed, Patient's Cardiovascular Status Stable, Respiratory Function Stable, Patent Airway, No signs of Nausea or vomiting and Pain level controlled  Post-op Vital Signs: Reviewed and stable  Last Vitals:  Filed Vitals:   10/17/13 1545  BP: 117/64  Pulse: 67  Temp:   Resp: 11    Complications: No apparent anesthesia complications

## 2013-10-17 NOTE — Addendum Note (Signed)
Addendum created 10/17/13 1559 by Germaine PomfretE. Carswell Jackson, MD   Modules edited: Orders

## 2013-10-18 ENCOUNTER — Encounter (HOSPITAL_COMMUNITY): Payer: Self-pay | Admitting: Orthopedic Surgery

## 2013-10-18 LAB — BASIC METABOLIC PANEL
BUN: 17 mg/dL (ref 6–23)
CHLORIDE: 100 meq/L (ref 96–112)
CO2: 30 mEq/L (ref 19–32)
Calcium: 9.3 mg/dL (ref 8.4–10.5)
Creatinine, Ser: 0.82 mg/dL (ref 0.50–1.10)
GFR, EST AFRICAN AMERICAN: 78 mL/min — AB (ref >90)
GFR, EST NON AFRICAN AMERICAN: 67 mL/min — AB (ref >90)
Glucose, Bld: 133 mg/dL — ABNORMAL HIGH (ref 70–99)
POTASSIUM: 4.2 meq/L (ref 3.7–5.3)
Sodium: 139 mEq/L (ref 137–147)

## 2013-10-18 LAB — CBC
HCT: 37.2 % (ref 36.0–46.0)
Hemoglobin: 11.9 g/dL — ABNORMAL LOW (ref 12.0–15.0)
MCH: 29.2 pg (ref 26.0–34.0)
MCHC: 32 g/dL (ref 30.0–36.0)
MCV: 91.4 fL (ref 78.0–100.0)
PLATELETS: 181 10*3/uL (ref 150–400)
RBC: 4.07 MIL/uL (ref 3.87–5.11)
RDW: 14.4 % (ref 11.5–15.5)
WBC: 8.6 10*3/uL (ref 4.0–10.5)

## 2013-10-18 MED ORDER — FAMOTIDINE 10 MG PO TABS
10.0000 mg | ORAL_TABLET | Freq: Every day | ORAL | Status: DC
  Administered 2013-10-18 – 2013-10-20 (×3): 10 mg via ORAL
  Filled 2013-10-18 (×3): qty 1

## 2013-10-18 NOTE — Progress Notes (Signed)
OT Cancellation Note  Patient Details Name: Vicie MuttersLee C Westerfeld MRN: [id number] DOB: 01-24-1934   Cancelled Treatment:    Reason Eval/Treat Not Completed: Other (comment) (Pt is going to SNF at d/c. Deferring OT needs to next venue.)  Pilar GrammesMathews, Kathryn H 10/18/2013, 12:20 PM

## 2013-10-18 NOTE — Discharge Instructions (Signed)
Diet: As you were doing prior to hospitalization  ° °Shower:  May shower but keep the wounds dry, use an occlusive plastic wrap, NO SOAKING IN TUB.  If the bandage gets wet, change with a clean dry gauze. ° °Dressing:  You may change your dressing 3-5 days after surgery.  Then change the dressing daily with sterile gauze dressing.   ° °There are sticky tapes (steri-strips) on your wounds and all the stitches are absorbable.  Leave the steri-strips in place when changing your dressings, they will peel off with time, usually 2-3 weeks. ° °Activity:  Increase activity slowly as tolerated, but follow the weight bearing instructions below.  No lifting or driving for 6 weeks. ° °Weight Bearing:   As tolerated.   ° °To prevent constipation: you may use a stool softener such as - ° °Colace (over the counter) 100 mg by mouth twice a day  °Drink plenty of fluids (prune juice may be helpful) and high fiber foods °Miralax (over the counter) for constipation as needed.   ° °Itching:  If you experience itching with your medications, try taking only a single pain pill, or even half a pain pill at a time.  You may take up to 10 pain pills per day, and you can also use benadryl over the counter for itching or also to help with sleep.  ° °Precautions:  If you experience chest pain or shortness of breath - call 911 immediately for transfer to the hospital emergency department!! ° °If you develop a fever greater that 101 F, purulent drainage from wound, increased redness or drainage from wound, or calf pain -- Call the office at [phone]                                                °Follow- Up Appointment:  Please call for an appointment to be seen in 2 weeks [place] - [phone] ° °___________________________________ ° ° °Information on my medicine - XARELTO® (Rivaroxaban) ° °This medication education was reviewed with me or my healthcare representative as part of my discharge preparation.  The pharmacist that spoke with  me during my hospital stay was:  Shamieka Gullo P, RPH ° °Why was Xarelto® prescribed for you? °Xarelto® was prescribed for you to reduce the risk of blood clots forming after orthopedic surgery. The medical term for these abnormal blood clots is venous thromboembolism (VTE). ° °What do you need to know about xarelto® ? °Take your Xarelto® ONCE DAILY at the same time every day. °You may take it either with or without food. ° °If you have difficulty swallowing the tablet whole, you may crush it and mix in applesauce just prior to taking your dose. ° °Take Xarelto® exactly as prescribed by your doctor and DO NOT stop taking Xarelto® without talking to the doctor who prescribed the medication.  Stopping without other VTE prevention medication to take the place of Xarelto® may increase your risk of developing a clot. ° °After discharge, you should have regular check-up appointments with your healthcare provider that is prescribing your Xarelto®.   ° °What do you do if you miss a dose? °If you miss a dose, take it as soon as you remember on the same day then continue your regularly scheduled once daily regimen the next day. Do not take two doses of Xarelto® on the same day.  ° °  Important Safety Information °A possible side effect of Xarelto® is bleeding. You should call your healthcare provider right away if you experience any of the following: °  Bleeding from an injury or your nose that does not stop. °  Unusual colored urine (red or dark brown) or unusual colored stools (red or black). °  Unusual bruising for unknown reasons. °  A serious fall or if you hit your head (even if there is no bleeding). ° °Some medicines may interact with Xarelto® and might increase your risk of bleeding while on Xarelto®. To help avoid this, consult your healthcare provider or pharmacist prior to using any new prescription or non-prescription medications, including herbals, vitamins, non-steroidal anti-inflammatory drugs (NSAIDs) and  supplements. ° °This website has more information on Xarelto®: www.xarelto.com. ° ° °

## 2013-10-18 NOTE — Clinical Social Work Placement (Addendum)
Clinical Social Work Department CLINICAL SOCIAL WORK PLACEMENT NOTE 10/18/2013  Patient:  Ruth Ford,Ruth Ford  Account Number:  [id number] Admit date:  10/17/2013  Clinical Social Worker:  Mosie EpsteinEMILY S VAUGHN, LCSWA  Date/time:  10/18/2013 11:58 AM  Clinical Social Work is seeking post-discharge placement for this patient at the following level of care:   SKILLED NURSING   (*CSW will update this form in Epic as items are completed)   10/18/2013  Patient/family provided with [facility] Department of Clinical Social Work's list of facilities offering this level of care within the geographic area requested by the patient (or if unable, by the patient's family).  10/18/2013  Patient/family informed of their freedom to choose among providers that offer the needed level of care, that participate in Medicare, Medicaid or managed care program needed by the patient, have an available bed and are willing to accept the patient.  10/18/2013  Patient/family informed of [facility]' ownership interest in [facility], as well as of the fact that they are under no obligation to receive care at this facility.  PASARR submitted to EDS on  PASARR number received on   FL2 transmitted to all facilities in geographic area requested by pt/family on  10/18/2013 FL2 transmitted to all facilities within larger geographic area on   Patient informed that his/her managed care company has contracts with or will negotiate with  certain facilities, including the following:     Patient/family informed of bed offers received: 10/18/13  [facility] Physician recommends and patient chooses bed at    Patient to be transferred to [facility] on 10/20/13 Patient to be transferred to facility by ambulance Patient and family notified of transfer on 10/20/13 Name of family member notified: Daughter - Judi Saaammy Hopkins     The following physician request were entered in  Epic:   Additional Comments: PASARR already existing.  Marcelline DeistEmily Vaughn, MSW, [facility] Licensed Clinical Social Worker [id number] and [id number] [phone]

## 2013-10-18 NOTE — Progress Notes (Signed)
     Subjective:  Patient reports pain as mild.  Able to void, concerned about reflux.  Objective:   VITALS:   Filed Vitals:   10/18/13 0117 10/18/13 0357 10/18/13 0539 10/18/13 0542  BP: 116/54  94/59 110/56  Pulse: 78  77 84  Temp: 97.4 F (36.3 C)  98 F (36.7 C)   TempSrc:      Resp: 16 16 16    Height:      Weight:      SpO2: 99% 100% 100%     Neurologically intact Dorsiflexion/Plantar flexion intact Incision: dressing C/D/I   Lab Results  Component Value Date   WBC 8.6 10/18/2013   HGB 11.9* 10/18/2013   HCT 37.2 10/18/2013   MCV 91.4 10/18/2013   PLT 181 10/18/2013     Assessment/Plan: 1 Day Post-Op   Principal Problem:   Osteoarthritis of right hip Active Problems:   Hip arthritis   Advance diet Up with therapy Discharge to SNF Friday, [facility].  pepcid for reflux.   LANDAU,JOSHUA P 10/18/2013, 8:10 AM Cell [phone]

## 2013-10-18 NOTE — Evaluation (Signed)
Physical Therapy Evaluation Patient Details Name: Ruth Ford MRN: [id number] DOB: 02/23/1934 Today's Date: 10/18/2013   History of Present Illness  80 y.o. female s/p right total hip arthroplasty. Hx of GERD and left THA  07/07/2012.  Clinical Impression  Pt is s/p right THA resulting in the deficits listed below (see PT Problem List). She was able to ambulate up to 20 feet this AM with min assist using rolling walker. Pt will benefit from skilled PT to increase their independence and safety with mobility to allow discharge to the venue listed below.        Follow Up Recommendations SNF;Supervision for mobility/OOB    Equipment Recommendations  Rolling walker with 5" wheels;3in1 (PT) ((pt reports no assistive device for shower))    Recommendations for Other Services       Precautions / Restrictions Precautions Precautions: Posterior Hip Precaution Comments: Reviewed posterior hip precautions Restrictions Weight Bearing Restrictions: Yes RLE Weight Bearing: Weight bearing as tolerated      Mobility  Bed Mobility Overal bed mobility: Needs Assistance Bed Mobility: Supine to Sit     Supine to sit: Min assist;HOB elevated     General bed mobility comments: Min assist for RLE support to off of bed and to scoot right hip forward. Cues for technique.  Transfers Overall transfer level: Needs assistance Equipment used: Rolling walker (2 wheeled) Transfers: Sit to/from Stand Sit to Stand: Min guard         General transfer comment: Min guard for safety with VCs for hand placement. Requires extra time but shows fair stability upon standing.  Ambulation/Gait Ambulation/Gait assistance: Min assist Ambulation Distance (Feet): 20 Feet Assistive device: Rolling walker (2 wheeled) Gait Pattern/deviations: Step-to pattern;Decreased step length - left;Decreased stance time - right;Antalgic   Gait velocity interpretation: Below normal speed for age/gender General Gait Details:  Min assist and VCs for sequencing and RW placement. Progressed to min guard towards end of distance. VCs also for forward gaze. Educated on safe DME use.  Stairs            Wheelchair Mobility    Modified Rankin (Stroke Patients Only)       Balance Overall balance assessment: Needs assistance Sitting-balance support: No upper extremity supported;Feet supported Sitting balance-Leahy Scale: Good     Standing balance support: Single extremity supported Standing balance-Leahy Scale: Poor                               Pertinent Vitals/Pain Pt reports pain as mild. No numerical value given Patient repositioned in chair for comfort.     Home Living Family/patient expects to be discharged to:: Skilled nursing facility Living Arrangements: Alone Available Help at Discharge: Skilled Nursing Facility Type of Home: Apartment Home Access: Stairs to enter Entrance Stairs-Rails: None Entrance Stairs-Number of Steps: 2 Home Layout: One level Home Equipment: Scientist, forensicWalker - standard;Walker - 4 wheels;Bedside commode      Prior Function Level of Independence: Independent with assistive device(s)         Comments: Intermittent use of rollator for long distances.     Hand Dominance   Dominant Hand: Right    Extremity/Trunk Assessment   Upper Extremity Assessment: Defer to OT evaluation           Lower Extremity Assessment: RLE deficits/detail RLE Deficits / Details: Decreased strength and ROM as expected post-op       Communication   Communication: No difficulties  Cognition  Arousal/Alertness: Awake/alert Behavior During Therapy: WFL for tasks assessed/performed Overall Cognitive Status: Within Functional Limits for tasks assessed                      General Comments General comments (skin integrity, edema, etc.): Safely maintained posterior hip precautions throughout therapy however only able to accurately verbalize 1/3 precautions.     Exercises Total Joint Exercises Ankle Circles/Pumps: AROM;Both;10 reps;Supine Long Arc Quad: AROM;Right;10 reps;Seated      Assessment/Plan    PT Assessment Patient needs continued PT services  PT Diagnosis Difficulty walking;Abnormality of gait;Acute pain   PT Problem List Decreased strength;Decreased range of motion;Decreased activity tolerance;Decreased balance;Decreased mobility;Decreased knowledge of use of DME;Decreased knowledge of precautions;Pain  PT Treatment Interventions DME instruction;Gait training;Stair training;Functional mobility training;Therapeutic activities;Therapeutic exercise;Balance training;Neuromuscular re-education;Patient/family education;Modalities   PT Goals (Current goals can be found in the Care Plan section) Acute Rehab PT Goals Patient Stated Goal: Get better PT Goal Formulation: With patient Time For Goal Achievement: 10/25/13 Potential to Achieve Goals: Good    Frequency 7X/week   Barriers to discharge Decreased caregiver support Pt lives alone    Co-evaluation               End of Session Equipment Utilized During Treatment: Gait belt Activity Tolerance: Patient tolerated treatment well Patient left: in chair;with call bell/phone within reach Nurse Communication: Mobility status         Time: 0912-0948 PT Time Calculation (min): 36 min   Charges:   PT Evaluation $Initial PT Evaluation Tier I: 1 Procedure PT Treatments $Gait Training: 8-22 mins $Therapeutic Activity: 8-22 mins   PT G Codes:         Charlsie MerlesLogan Secor Barbour, [place] [phone]  Berton MountBarbour, Logan S 10/18/2013, 10:13 AM

## 2013-10-18 NOTE — Clinical Social Work Psychosocial (Signed)
Clinical Social Work Department BRIEF PSYCHOSOCIAL ASSESSMENT 10/18/2013  Patient:  Ruth Ford,Ruth Ford     Account Number:  [id number]     Admit date:  10/17/2013  Clinical Social Worker:  Mosie EpsteinVAUGHN,EMILY S, LCSWA  Date/Time:  10/18/2013 11:47 AM  Referred by:  Physician  Date Referred:  10/18/2013 Referred for  SNF Placement   Other Referral:   none.   Interview type:  Family Other interview type:   CSW spoke with pt's niece, Tammy [[phone]]    PSYCHOSOCIAL DATA Living Status:  ALONE Admitted from facility:   Level of care:   Primary support name:  Tammy Hopkins Primary support relationship to patient:  FAMILY Degree of support available:   Strong support system.    CURRENT CONCERNS Current Concerns  Post-Acute Placement   Other Concerns:   none.    SOCIAL WORK ASSESSMENT / PLAN CSW received consult for possible SNF placement at time of discharge. Per consult, pt's family interested in [facility] for pt at time of discharge. CSW spoke with pt's niece, Tammy, regarding discharge disposition. Pt's niece expressed concern in confirming bed [facility]. CSW offered support to pt's niece. CSW discussed CSW role and process in completing SNF search. Pt's niece understanding of SNF search process. Pt's niece verbalized understanding of need for a back-up choice with SNF.    CSW to continue to follow and assist with discharge planning needs.   Assessment/plan status:  Psychosocial Support/Ongoing Assessment of Needs Other assessment/ plan:   none.   Information/referral to community resources:   [place] [facility] [place]    PATIENT'S/FAMILY'S RESPONSE TO PLAN OF CARE: Pt's niece understanding and agreeable to CSW plan of care. Pt's niece expressed concern with possibility of limited bed choices as [facility]. CSW offered support.       Marcelline DeistEmily Vaughn, MSW, [facility] Licensed Clinical Social Worker [phone] and  [phone] [phone]

## 2013-10-19 LAB — BASIC METABOLIC PANEL
BUN: 18 mg/dL (ref 6–23)
CHLORIDE: 104 meq/L (ref 96–112)
CO2: 28 mEq/L (ref 19–32)
Calcium: 10 mg/dL (ref 8.4–10.5)
Creatinine, Ser: 0.92 mg/dL (ref 0.50–1.10)
GFR calc Af Amer: 68 mL/min — ABNORMAL LOW (ref >90)
GFR calc non Af Amer: 59 mL/min — ABNORMAL LOW (ref >90)
Glucose, Bld: 122 mg/dL — ABNORMAL HIGH (ref 70–99)
POTASSIUM: 4.2 meq/L (ref 3.7–5.3)
Sodium: 142 mEq/L (ref 137–147)

## 2013-10-19 LAB — CBC
HEMATOCRIT: 36.4 % (ref 36.0–46.0)
HEMOGLOBIN: 11.9 g/dL — AB (ref 12.0–15.0)
MCH: 29.8 pg (ref 26.0–34.0)
MCHC: 32.7 g/dL (ref 30.0–36.0)
MCV: 91.2 fL (ref 78.0–100.0)
Platelets: 174 10*3/uL (ref 150–400)
RBC: 3.99 MIL/uL (ref 3.87–5.11)
RDW: 14.6 % (ref 11.5–15.5)
WBC: 13.1 10*3/uL — AB (ref 4.0–10.5)

## 2013-10-19 NOTE — Progress Notes (Signed)
Physical Therapy Treatment Patient Details Name: Ruth MuttersLee C Ledonne MRN: [id number] DOB: 05-02-1934 Today's Date: 10/19/2013    History of Present Illness 80 y.o. female s/p right total hip arthroplasty. Hx of GERD and left THA  07/07/2012.    PT Comments    Patient is progressing towards physical therapy goals, ambulating up to 50 feet this afternoon with min guard for safety, pt relies heavily on rolling walker for support. Tolerating exercises well but only recalls 1/3 posterior hip precautions correctly. Reviewed precaution handout. Patient will continue to benefit from skilled physical therapy services to further improve independence with functional mobility.    Follow Up Recommendations  SNF;Supervision for mobility/OOB     Equipment Recommendations  Rolling walker with 5" wheels;3in1 (PT) ((pt reports no assistive device for shower))    Recommendations for Other Services       Precautions / Restrictions Precautions Precautions: Posterior Hip Precaution Booklet Issued: Yes (comment) Precaution Comments: Reviewed posterior hip precautions Restrictions Weight Bearing Restrictions: Yes RLE Weight Bearing: Weight bearing as tolerated    Mobility  Bed Mobility Overal bed mobility: Needs Assistance Bed Mobility: Supine to Sit     Supine to sit: Min assist     General bed mobility comments: Min assist for RLE support to off of bed. Cues for technique. Requires extra time.  Transfers Overall transfer level: Needs assistance Equipment used: Rolling walker (2 wheeled) Transfers: Sit to/from Stand Sit to Stand: Min guard         General transfer comment: Min guard for safety with VCs for hand placement. Requires extra time. Performed from lowest bed setting and BSC.  Ambulation/Gait Ambulation/Gait assistance: Min guard Ambulation Distance (Feet): 50 Feet Assistive device: Rolling walker (2 wheeled) Gait Pattern/deviations: Step-to pattern;Decreased step length -  left;Decreased stance time - right;Antalgic     General Gait Details: Min guard with VCs for sequencing and RW placement. Educated on safe DME use. Relies heavily on RW with UE use for balance   Stairs            Wheelchair Mobility    Modified Rankin (Stroke Patients Only)       Balance                                    Cognition Arousal/Alertness: Awake/alert Behavior During Therapy: WFL for tasks assessed/performed Overall Cognitive Status: Within Functional Limits for tasks assessed                      Exercises Total Joint Exercises Ankle Circles/Pumps: AROM;Both;10 reps;Supine Quad Sets: AROM;Both;10 reps;Supine Hip ABduction/ADduction: AAROM;Right;10 reps;Supine Straight Leg Raises: AAROM;Right;10 reps;Supine    General Comments General comments (skin integrity, edema, etc.): Able to recall 1/3 posterior hip precautions. Reviewed handout with pt.      Pertinent Vitals/Pain 7/10 pain Nurse notified Patient repositioned in chair for comfort.     Home Living                      Prior Function            PT Goals (current goals can now be found in the care plan section) Acute Rehab PT Goals PT Goal Formulation: With patient Time For Goal Achievement: 10/25/13 Potential to Achieve Goals: Good Progress towards PT goals: Progressing toward goals    Frequency  7X/week    PT Plan  Co-evaluation             End of Session   Activity Tolerance: Patient tolerated treatment well Patient left: in chair;with call bell/phone within reach     Time: 1349-1415 PT Time Calculation (min): 26 min  Charges:  $Gait Training: 8-22 mins $Therapeutic Exercise: 8-22 mins                    G Codes:      BJ's WholesaleLogan Secor Amedio [place], [place]PT [phone]  Berton MountBarbour, Gustavo Meditz S 10/19/2013, 3:04 PM

## 2013-10-19 NOTE — Progress Notes (Signed)
Discussed and agree with above 

## 2013-10-19 NOTE — Progress Notes (Signed)
Patient ID: Ruth Ford, female   DOB: 31-Jul-1933, 80 y.o.   MRN: [id number]     Subjective:  Patient reports pain as mild to moderate.  She denies any CP or SOB states that she feels better.  Objective:   VITALS:   Filed Vitals:   10/18/13 2039 10/19/13 0000 10/19/13 0356 10/19/13 0452  BP: 103/48   108/52  Pulse: 81   82  Temp: 98 F (36.7 C)   98.1 F (36.7 C)  TempSrc:      Resp: 18 18 18 18   Height:      Weight:      SpO2: 98% 98% 100% 98%    ABD soft Sensation intact distally Dorsiflexion/Plantar flexion intact Incision: dressing C/D/I and no drainage Good foot and ankle motion  Lab Results  Component Value Date   WBC 8.6 10/18/2013   HGB 11.9* 10/18/2013   HCT 37.2 10/18/2013   MCV 91.4 10/18/2013   PLT 181 10/18/2013     Assessment/Plan: 2 Days Post-Op   Principal Problem:   Osteoarthritis of right hip Active Problems:   Hip arthritis   Advance diet Up with therapy Plan for discharge tomorrow WBAT Dry dressing PRN  Plan for SNF tomorrow   Haskel KhanDOUGLAS Thekla Colborn 10/19/2013, 7:16 AM   Teryl LucyJoshua Landau, MD Cell [phone]

## 2013-10-20 ENCOUNTER — Encounter (HOSPITAL_COMMUNITY): Payer: Self-pay | Admitting: *Deleted

## 2013-10-20 DIAGNOSIS — M1990 Unspecified osteoarthritis, unspecified site: Secondary | ICD-10-CM | POA: Diagnosis not present

## 2013-10-20 DIAGNOSIS — R262 Difficulty in walking, not elsewhere classified: Secondary | ICD-10-CM | POA: Diagnosis not present

## 2013-10-20 DIAGNOSIS — F411 Generalized anxiety disorder: Secondary | ICD-10-CM | POA: Diagnosis not present

## 2013-10-20 DIAGNOSIS — Z96649 Presence of unspecified artificial hip joint: Secondary | ICD-10-CM | POA: Diagnosis not present

## 2013-10-20 DIAGNOSIS — Z471 Aftercare following joint replacement surgery: Secondary | ICD-10-CM | POA: Diagnosis not present

## 2013-10-20 DIAGNOSIS — S79919A Unspecified injury of unspecified hip, initial encounter: Secondary | ICD-10-CM | POA: Diagnosis not present

## 2013-10-20 DIAGNOSIS — R279 Unspecified lack of coordination: Secondary | ICD-10-CM | POA: Diagnosis not present

## 2013-10-20 DIAGNOSIS — M6281 Muscle weakness (generalized): Secondary | ICD-10-CM | POA: Diagnosis not present

## 2013-10-20 DIAGNOSIS — M25559 Pain in unspecified hip: Secondary | ICD-10-CM | POA: Diagnosis not present

## 2013-10-20 DIAGNOSIS — I10 Essential (primary) hypertension: Secondary | ICD-10-CM | POA: Diagnosis not present

## 2013-10-20 DIAGNOSIS — Z96659 Presence of unspecified artificial knee joint: Secondary | ICD-10-CM | POA: Diagnosis not present

## 2013-10-20 DIAGNOSIS — K5900 Constipation, unspecified: Secondary | ICD-10-CM | POA: Diagnosis not present

## 2013-10-20 DIAGNOSIS — K219 Gastro-esophageal reflux disease without esophagitis: Secondary | ICD-10-CM | POA: Diagnosis not present

## 2013-10-20 LAB — CBC
HEMATOCRIT: 36.6 % (ref 36.0–46.0)
HEMOGLOBIN: 11.8 g/dL — AB (ref 12.0–15.0)
MCH: 29.8 pg (ref 26.0–34.0)
MCHC: 32.2 g/dL (ref 30.0–36.0)
MCV: 92.4 fL (ref 78.0–100.0)
Platelets: 192 10*3/uL (ref 150–400)
RBC: 3.96 MIL/uL (ref 3.87–5.11)
RDW: 14.7 % (ref 11.5–15.5)
WBC: 9.9 10*3/uL (ref 4.0–10.5)

## 2013-10-20 MED ORDER — HYDROMORPHONE HCL 2 MG PO TABS: 2.0000 mg | ORAL_TABLET | Freq: Four times a day (QID) | ORAL | Status: DC | PRN

## 2013-10-20 NOTE — Progress Notes (Signed)
Discussed and agree. Patient and family requesting dilaudid, will have Dr. Wandra Feinstein Murphy prepare, as I am in [facility] conference.

## 2013-10-20 NOTE — Progress Notes (Signed)
Call [facility] and spoke with Corrie DandyMary, RN.  She will be taking care of patient when she arrives.  I also informed Erie NoeVanessa, SW that report has been called to discuss transportation.

## 2013-10-20 NOTE — Discharge Summary (Signed)
Physician Discharge Summary  Patient ID: Ruth Ford MRN: [id number] DOB/AGE: 1933-09-15 80 y.o.  Admit date: 10/17/2013 Discharge date: 10/20/2013  Admission Diagnoses:  Osteoarthritis of right hip  Discharge Diagnoses:  Principal Problem:   Osteoarthritis of right hip Active Problems:   Hip arthritis   Past Medical History  Diagnosis Date  . Arthritis   . Primary localized osteoarthrosis, lower leg, left 07/08/2012  . Distal radius fracture, left 07/08/2012  . Shortness of breath   . Anxiety     occ  . GERD (gastroesophageal reflux disease)   . Complication of anesthesia     "goes to sleep fast"  . Osteoarthritis of right hip 10/17/2013    Surgeries: Procedure(s): TOTAL HIP ARTHROPLASTY on 10/17/2013   Consultants (if any):    Discharged Condition: Improved  Hospital Course: Ruth Ford is an 80 y.o. female who was admitted 10/17/2013 with a diagnosis of Osteoarthritis of right hip and went to the operating room on 10/17/2013 and underwent the above named procedures.    She was given perioperative antibiotics:  Anti-infectives   Start     Dose/Rate Route Frequency Ordered Stop   10/17/13 1900  ceFAZolin (ANCEF) IVPB 2 g/50 mL premix     2 g 100 mL/hr over 30 Minutes Intravenous Every 6 hours 10/17/13 1753 10/18/13 0427   10/17/13 0600  ceFAZolin (ANCEF) IVPB 2 g/50 mL premix     2 g 100 mL/hr over 30 Minutes Intravenous On call to O.R. 10/16/13 1405 10/17/13 1128    .  She was given sequential compression devices, early ambulation, and Xarelto for DVT prophylaxis.  She benefited maximally from the hospital stay and there were no complications.    Recent vital signs:  Filed Vitals:   10/20/13 1518  BP:   Pulse:   Temp:   Resp: 16    Recent laboratory studies:  Lab Results  Component Value Date   HGB 11.8* 10/20/2013   HGB 11.9* 10/19/2013   HGB 11.9* 10/18/2013   Lab Results  Component Value Date   WBC 9.9 10/20/2013   PLT 192 10/20/2013   Lab  Results  Component Value Date   INR 0.98 10/10/2013   Lab Results  Component Value Date   NA 142 10/19/2013   K 4.2 10/19/2013   CL 104 10/19/2013   CO2 28 10/19/2013   BUN 18 10/19/2013   CREATININE 0.92 10/19/2013   GLUCOSE 122* 10/19/2013    Discharge Medications:     Medication List    STOP taking these medications       oxyCODONE-acetaminophen 5-325 MG per tablet  Commonly known as:  PERCOCET/ROXICET  Replaced by:  oxyCODONE-acetaminophen 10-325 MG per tablet      TAKE these medications       baclofen 10 MG tablet  Commonly known as:  LIORESAL  Take 1 tablet (10 mg total) by mouth 3 (three) times daily. As needed for muscle spasm     famotidine-calcium carbonate-magnesium hydroxide 10-800-165 MG Chew chewable tablet  Commonly known as:  PEPCID COMPLETE  Chew 1 tablet by mouth daily as needed (indigestion).     fish oil-omega-3 fatty acids 1000 MG capsule  Take 1 g by mouth daily.     hydrochlorothiazide 25 MG tablet  Commonly known as:  HYDRODIURIL  Take 25 mg by mouth daily.     HYDROmorphone 2 MG tablet  Commonly known as:  DILAUDID  Take 1 tablet (2 mg total) by mouth every 6 (six) hours  as needed for severe pain.     LORazepam 0.5 MG tablet  Commonly known as:  ATIVAN  Take 0.5 mg by mouth every 8 (eight) hours.     ondansetron 4 MG tablet  Commonly known as:  ZOFRAN  Take 1 tablet (4 mg total) by mouth every 8 (eight) hours as needed for nausea or vomiting.     oxyCODONE-acetaminophen 10-325 MG per tablet  Commonly known as:  PERCOCET  Take 1-2 tablets by mouth every 6 (six) hours as needed for pain. MAXIMUM TOTAL ACETAMINOPHEN DOSE IS 4000 MG PER DAY     rivaroxaban 10 MG Tabs tablet  Commonly known as:  XARELTO  Take 1 tablet (10 mg total) by mouth daily.     sennosides-docusate sodium 8.6-50 MG tablet  Commonly known as:  SENOKOT-S  Take 1 tablet by mouth daily.        Diagnostic Studies: Dg Ankle Complete Left  10/11/2013   CLINICAL DATA:   Fall with pain and swelling  EXAM: LEFT ANKLE COMPLETE - 3+ VIEW  COMPARISON:  None.  FINDINGS: Normal alignment and no fracture.  Calcaneal spurring noted.  IMPRESSION: Negative for fracture.   Electronically Signed   By: Marlan Palauharles  Clark M.D.   On: 10/11/2013 14:16   Dg Pelvis Portable  10/17/2013   CLINICAL DATA:  Postop right hip replacement.  EXAM: PORTABLE PELVIS 1-2 VIEWS  COMPARISON:  07/07/2012  FINDINGS: Sequelae of interval right total hip arthroplasty are identified. The prosthetic femoral and acetabular components are approximated on this AP image. Overlying postoperative soft tissue emphysema is noted. Prior left total hip arthroplasty is again seen. No acute osseous abnormality is identified.  IMPRESSION: Interval right total hip arthroplasty as above.   Electronically Signed   By: Sebastian AcheAllen  Grady   On: 10/17/2013 15:21   Dg Hip Portable 1 View Right  10/17/2013   CLINICAL DATA:  Status post right hip replacement.  EXAM: PORTABLE RIGHT HIP - 1 VIEW  COMPARISON:  07/07/2012.  FINDINGS: Single cross-table lateral portable view of the right hip demonstrates postoperative changes of right total hip arthroplasty. The femoral head projects within the acetabular cup on this single view examination, and in correlation with the AP pelvis, this is clearly located. No definite periprosthetic fracture or other acute complicating features.  IMPRESSION: 1. Status post right total hip arthroplasty without acute complicating features.   Electronically Signed   By: Trudie Reedaniel  Entrikin M.D.   On: 10/17/2013 15:22   Dg Foot Complete Left  10/11/2013   CLINICAL DATA:  Foot and ankle swelling post fall  EXAM: LEFT FOOT - COMPLETE 3+ VIEW  COMPARISON:  None  FINDINGS: Osseous demineralization.  Joint spaces preserved.  No acute fracture, dislocation or bone destruction.  Small plantar calcaneal spur.  Small amount of calcification is seen at the posterior LEFT lower leg likely at the region of the musculotendinous  junction of the Achilles tendon.  IMPRESSION: No acute osseous abnormalities.  Question chronic calcific tendinitis of the Achilles at the musculotendinous junction.   Electronically Signed   By: Ulyses SouthwardMark  Boles M.D.   On: 10/11/2013 14:19    Disposition: 01-Home or Self Care      Discharge Instructions   Posterior total hip precautions    Complete by:  As directed      Weight bearing as tolerated    Complete by:  As directed            Follow-up Information   Follow up with  Eulas Post, MD. Schedule an appointment as soon as possible for a visit in 2 weeks.   Specialty:  Orthopedic Surgery   Contact information:   [address] [place] [address] [phone]        Signed: Sheral Apley 10/20/2013, 3:25 PM

## 2013-10-20 NOTE — Progress Notes (Signed)
[facility] at 19:15. Pain medicine was given to help with easing the ride. The patient stated that she had already called her niece and made her aware of the discharge.

## 2013-10-20 NOTE — Progress Notes (Signed)
Patient ID: Vicie MuttersLee C Kusek, female   DOB: [birth year], 80 y.o.   MRN: [id number]     Subjective:  Patient reports pain as mild.  Patient waiting on SNF placement should be able to go today  Objective:   VITALS:   Filed Vitals:   10/19/13 2053 10/20/13 0000 10/20/13 0400 10/20/13 0555  BP: 120/56   115/76  Pulse: 92   73  Temp: 99.4 F (37.4 C)   98.9 F (37.2 C)  TempSrc:      Resp: 18 17 16 18   Height:      Weight:      SpO2: 100%   95%    ABD soft Sensation intact distally Dorsiflexion/Plantar flexion intact Incision: dressing C/D/I and no drainage Normal ankle and foot function  Lab Results  Component Value Date   WBC 13.1* 10/19/2013   HGB 11.9* 10/19/2013   HCT 36.4 10/19/2013   MCV 91.2 10/19/2013   PLT 174 10/19/2013     Assessment/Plan: 3 Days Post-Op   Principal Problem:   Osteoarthritis of right hip Active Problems:   Hip arthritis   Advance diet Up with therapy Discharge to SNF WBAT Dry dressing PRN Follow up in two weeks with Dr Shella MaximLnadau   Torrie MayersUGLAS Shooter Tangen 10/20/2013, 7:51 AM   Teryl LucyJoshua Landau, MD Cell [phone]

## 2013-10-20 NOTE — Progress Notes (Signed)
Physical Therapy Treatment Patient Details Name: Vicie MuttersLee C Ebright MRN: [id number] DOB: 06/29/1933 Today's Date: 10/20/2013    History of Present Illness 80 y.o. female s/p right total hip arthroplasty. Hx of GERD and left THA  07/07/2012.    PT Comments    Patient having increased pain this morning but agreeable to ambulate. Progressing well. Anticipate DC later today to SNF  Follow Up Recommendations  SNF;Supervision for mobility/OOB     Equipment Recommendations  Rolling walker with 5" wheels;3in1 (PT)    Recommendations for Other Services       Precautions / Restrictions Precautions Precautions: Posterior Hip Precaution Comments: Reviewed posterior hip precautions Restrictions RLE Weight Bearing: Weight bearing as tolerated    Mobility  Bed Mobility Overal bed mobility: Needs Assistance Bed Mobility: Supine to Sit     Supine to sit: Min assist     General bed mobility comments: Min assist for RLE support to off of bed. Cues for technique.   Transfers Overall transfer level: Needs assistance Equipment used: Rolling walker (2 wheeled)   Sit to Stand: Min guard            Ambulation/Gait Ambulation/Gait assistance: Min guard Ambulation Distance (Feet): 80 Feet Assistive device: Rolling walker (2 wheeled) Gait Pattern/deviations: Step-to pattern;Decreased step length - right;Decreased step length - left     General Gait Details: Min guard with VCs for sequencing and RW placement.    Stairs            Wheelchair Mobility    Modified Rankin (Stroke Patients Only)       Balance                                    Cognition Arousal/Alertness: Awake/alert Behavior During Therapy: WFL for tasks assessed/performed Overall Cognitive Status: Within Functional Limits for tasks assessed                      Exercises Total Joint Exercises Heel Slides: AAROM;Right;10 reps;Supine Hip ABduction/ADduction: AAROM;Right;10  reps;Supine    General Comments        Pertinent Vitals/Pain 10/10 R hip pain patient repositioned for comfort     Home Living                      Prior Function            PT Goals (current goals can now be found in the care plan section) Progress towards PT goals: Progressing toward goals    Frequency  7X/week    PT Plan Current plan remains appropriate    Co-evaluation             End of Session Equipment Utilized During Treatment: Gait belt Activity Tolerance: Patient tolerated treatment well Patient left: in chair;with call bell/phone within reach     Time: 0908-0933 PT Time Calculation (min): 25 min  Charges:  $Gait Training: 8-22 mins $Therapeutic Exercise: 8-22 mins                    G Codes:      Fredrich BirksRobinette, Julia Elizabeth 10/20/2013, 10:19 AM 10/20/2013 Fredrich Birksobinette, Julia Elizabeth PTA [phone] pager [phone] office

## 2013-10-20 NOTE — Progress Notes (Signed)
[facility] was called about an estimated pick-up time, but was told that they didn't have that information. Will continue to monitor.

## 2013-10-27 DIAGNOSIS — Z96659 Presence of unspecified artificial knee joint: Secondary | ICD-10-CM | POA: Diagnosis not present

## 2013-10-27 DIAGNOSIS — M1990 Unspecified osteoarthritis, unspecified site: Secondary | ICD-10-CM | POA: Diagnosis not present

## 2013-10-27 DIAGNOSIS — I10 Essential (primary) hypertension: Secondary | ICD-10-CM | POA: Diagnosis not present

## 2013-10-27 DIAGNOSIS — K219 Gastro-esophageal reflux disease without esophagitis: Secondary | ICD-10-CM | POA: Diagnosis not present

## 2013-11-07 DIAGNOSIS — Z96649 Presence of unspecified artificial hip joint: Secondary | ICD-10-CM | POA: Diagnosis not present

## 2013-11-07 DIAGNOSIS — Z471 Aftercare following joint replacement surgery: Secondary | ICD-10-CM | POA: Diagnosis not present

## 2013-11-09 ENCOUNTER — Encounter (HOSPITAL_COMMUNITY): Payer: Self-pay | Admitting: Orthopedic Surgery

## 2013-11-09 NOTE — OR Nursing (Signed)
Late entry on 11-09-2013 by D. Lavalle Skoda, RN to add surgery end time. 

## 2013-11-17 DIAGNOSIS — M1990 Unspecified osteoarthritis, unspecified site: Secondary | ICD-10-CM | POA: Diagnosis not present

## 2013-11-17 DIAGNOSIS — K219 Gastro-esophageal reflux disease without esophagitis: Secondary | ICD-10-CM | POA: Diagnosis not present

## 2013-11-17 DIAGNOSIS — I10 Essential (primary) hypertension: Secondary | ICD-10-CM | POA: Diagnosis not present

## 2013-11-17 NOTE — OR Nursing (Signed)
Late entry for type, subtype and infection under procedural recordings 

## 2013-12-01 DIAGNOSIS — Z471 Aftercare following joint replacement surgery: Secondary | ICD-10-CM | POA: Diagnosis not present

## 2013-12-01 DIAGNOSIS — Z5189 Encounter for other specified aftercare: Secondary | ICD-10-CM | POA: Diagnosis not present

## 2013-12-04 DIAGNOSIS — Z5189 Encounter for other specified aftercare: Secondary | ICD-10-CM | POA: Diagnosis not present

## 2013-12-04 DIAGNOSIS — Z471 Aftercare following joint replacement surgery: Secondary | ICD-10-CM | POA: Diagnosis not present

## 2013-12-06 DIAGNOSIS — Z5189 Encounter for other specified aftercare: Secondary | ICD-10-CM | POA: Diagnosis not present

## 2013-12-06 DIAGNOSIS — Z471 Aftercare following joint replacement surgery: Secondary | ICD-10-CM | POA: Diagnosis not present

## 2013-12-07 ENCOUNTER — Telehealth: Payer: Self-pay | Admitting: Family Medicine

## 2013-12-07 ENCOUNTER — Ambulatory Visit (INDEPENDENT_AMBULATORY_CARE_PROVIDER_SITE_OTHER): Payer: Medicare Other

## 2013-12-07 ENCOUNTER — Ambulatory Visit
Admission: RE | Admit: 2013-12-07 | Discharge: 2013-12-07 | Disposition: A | Discharge status: Home / Self Care | Payer: Medicare Other | Source: Ambulatory Visit | Attending: Family Medicine | Admitting: Family Medicine

## 2013-12-07 ENCOUNTER — Ambulatory Visit (INDEPENDENT_AMBULATORY_CARE_PROVIDER_SITE_OTHER): Payer: Medicare Other | Admitting: Family Medicine

## 2013-12-07 ENCOUNTER — Encounter: Payer: Self-pay | Admitting: Family Medicine

## 2013-12-07 VITALS — BP 110/70 | HR 78 | Temp 97.7°F | Resp 16

## 2013-12-07 DIAGNOSIS — R0602 Shortness of breath: Secondary | ICD-10-CM

## 2013-12-07 DIAGNOSIS — R911 Solitary pulmonary nodule: Secondary | ICD-10-CM | POA: Diagnosis not present

## 2013-12-07 LAB — COMPREHENSIVE METABOLIC PANEL
ALT: <8 U/L (ref 0–35)
AST: 12 U/L (ref 0–37)
Albumin: 4.3 g/dL (ref 3.5–5.2)
Alkaline Phosphatase: 79 U/L (ref 39–117)
BUN: 11 mg/dL (ref 6–23)
CO2: 30 meq/L (ref 19–32)
CREATININE: 1 mg/dL (ref 0.50–1.10)
Calcium: 10.6 mg/dL — ABNORMAL HIGH (ref 8.4–10.5)
Chloride: 100 mEq/L (ref 96–112)
Glucose, Bld: 104 mg/dL — ABNORMAL HIGH (ref 70–99)
Potassium: 4 mEq/L (ref 3.5–5.3)
Sodium: 141 mEq/L (ref 135–145)
Total Bilirubin: 0.6 mg/dL (ref 0.2–1.2)
Total Protein: 6.8 g/dL (ref 6.0–8.3)

## 2013-12-07 LAB — CBC WITH DIFFERENTIAL/PLATELET
Basophils Absolute: 0.1 10*3/uL (ref 0.0–0.1)
Basophils Relative: 1 % (ref 0–1)
EOS PCT: 5 % (ref 0–5)
Eosinophils Absolute: 0.3 10*3/uL (ref 0.0–0.7)
HCT: 42.3 % (ref 36.0–46.0)
Hemoglobin: 14.7 g/dL (ref 12.0–15.0)
LYMPHS ABS: 1.3 10*3/uL (ref 0.7–4.0)
Lymphocytes Relative: 22 % (ref 12–46)
MCH: 29.5 pg (ref 26.0–34.0)
MCHC: 34.8 g/dL (ref 30.0–36.0)
MCV: 84.8 fL (ref 78.0–100.0)
MONO ABS: 0.4 10*3/uL (ref 0.1–1.0)
Monocytes Relative: 7 % (ref 3–12)
Neutro Abs: 4 10*3/uL (ref 1.7–7.7)
Neutrophils Relative %: 65 % (ref 43–77)
Platelets: 315 10*3/uL (ref 150–400)
RBC: 4.99 MIL/uL (ref 3.87–5.11)
RDW: 14.6 % (ref 11.5–15.5)
WBC: 6.1 10*3/uL (ref 4.0–10.5)

## 2013-12-07 LAB — D-DIMER, QUANTITATIVE (NOT AT ARMC): D-Dimer, Quant: 1.07 ug/mL-FEU — ABNORMAL HIGH (ref 0.00–0.48)

## 2013-12-07 LAB — TROPONIN I: Troponin I: 0.01 ng/mL (ref <0.06)

## 2013-12-07 MED ORDER — IOHEXOL 350 MG/ML SOLN
100.0000 mL | Freq: Once | INTRAVENOUS | Status: AC | PRN
  Administered 2013-12-07: 100 mL via INTRAVENOUS

## 2013-12-07 NOTE — Progress Notes (Addendum)
[facility] [address] [place] [address] [phone]  Date:  12/07/2013   Name:  Ruth Ford   DOB:  19-Apr-1934   MRN:  [id number]  PCP:  Colette Ribas, MD    Chief Complaint: Shortness of Breath   History of Present Illness:  Ruth Ford is a 80 y.o. very pleasant female patient who presents with the following:  She is here today with SOB that started "soemtime during the night."  She "took my oxycontin last night and I couldn't breathe."  She woke up several times during the night but did sleep some off an on.   Then this morning she was taking her dog to the vet and felt worse- she felt unable to take a deep breath "for about 10 minutes" about an hour ago.  Her niece was with her and brought her in to be seen.    She is not having any chest pain. She feels "some" SOB right now but it is improved She did have a hip replacement about 2 months ago.  She is not on a blood thinner now.  Did take xarelto for a few weeks after operation  No prior history of DVT or PE, no cardiac history  She does admit to a history of anxiety and panic attacks, and thinks her sx today may have been due to panic   Never a smoker  Patient Active Problem List   Diagnosis Date Noted  . Osteoarthritis of right hip 10/17/2013  . Hip arthritis 10/17/2013  . Osteoarthritis of left hip 07/08/2012  . Distal radius fracture, left 07/08/2012    Past Medical History  Diagnosis Date  . Arthritis   . Primary localized osteoarthrosis, lower leg, left 07/08/2012  . Distal radius fracture, left 07/08/2012  . Shortness of breath   . Anxiety     occ  . GERD (gastroesophageal reflux disease)   . Complication of anesthesia     "goes to sleep fast"  . Osteoarthritis of right hip 10/17/2013    Past Surgical History  Procedure Laterality Date  . Back surgery    . Open reduction internal fixation (orif) distal radial fracture Left 07/07/2012    Procedure: OPEN REDUCTION INTERNAL  FIXATION (ORIF) DISTAL RADIAL FRACTURE;  Surgeon: Eulas Post, MD;  Location: [facility] OR;  Service: Orthopedics;  Laterality: Left;  . Total hip arthroplasty Left 07/07/2012    Procedure: TOTAL HIP ARTHROPLASTY;  Surgeon: Eulas Post, MD;  Location: [facility] OR;  Service: Orthopedics;  Laterality: Left;  . Joint replacement Left 07/07/12  . Eye surgery Bilateral     catract  . Total hip arthroplasty Right 10/17/2013    Procedure: TOTAL HIP ARTHROPLASTY;  Surgeon: Eulas Post, MD;  Location: [facility] OR;  Service: Orthopedics;  Laterality: Right;    History  Substance Use Topics  . Smoking status: Never Smoker   . Smokeless tobacco: Not on file  . Alcohol Use: No    History reviewed. No pertinent family history.  No Known Allergies  Medication list has been reviewed and updated.  Current Outpatient Prescriptions on File Prior to Visit  Medication Sig Dispense Refill  . fish oil-omega-3 fatty acids 1000 MG capsule Take 1 g by mouth daily.       . hydrochlorothiazide (HYDRODIURIL) 25 MG tablet Take 25 mg by mouth daily.      Marland Kitchen LORazepam (ATIVAN) 0.5 MG tablet Take 0.5 mg by mouth every 8 (eight) hours.      Marland Kitchen  ondansetron (ZOFRAN) 4 MG tablet Take 1 tablet (4 mg total) by mouth every 8 (eight) hours as needed for nausea or vomiting.  30 tablet  0  . oxyCODONE-acetaminophen (PERCOCET) 10-325 MG per tablet Take 1-2 tablets by mouth every 6 (six) hours as needed for pain. MAXIMUM TOTAL ACETAMINOPHEN DOSE IS 4000 MG PER DAY  75 tablet  0  . sennosides-docusate sodium (SENOKOT-S) 8.6-50 MG tablet Take 1 tablet by mouth daily.  30 tablet  1  . baclofen (LIORESAL) 10 MG tablet Take 1 tablet (10 mg total) by mouth 3 (three) times daily. As needed for muscle spasm  50 each  0  . famotidine-calcium carbonate-magnesium hydroxide (PEPCID COMPLETE) 10-800-165 MG CHEW Chew 1 tablet by mouth daily as needed (indigestion).       Marland Kitchen. HYDROmorphone (DILAUDID) 2 MG tablet Take 1 tablet (2 mg total) by mouth every 6  (six) hours as needed for severe pain.  40 tablet  0  . rivaroxaban (XARELTO) 10 MG TABS tablet Take 1 tablet (10 mg total) by mouth daily.  21 tablet  0   No current facility-administered medications on file prior to visit.    Review of Systems:  As per HPI- otherwise negative.   Physical Examination: Filed Vitals:   12/07/13 0903  BP: 140/86  Pulse: 95  Temp: 97.7 F (36.5 C)  Resp: 16   There were no vitals filed for this visit. There is no weight on file to calculate BMI. Ideal Body Weight:    GEN: WDWN, NAD, Non-toxic, A & O x 3, overweight, appears anxious but otherwise well HEENT: Atraumatic, Normocephalic. Neck supple. No masses, No LAD.  Bilateral TM wnl, oropharynx normal.  PEERL,EOMI.   Ears and Nose: No external deformity. CV: RRR, No M/G/R. No JVD. No thrill. No extra heart sounds. PULM: CTA B, no wheezes, crackles, rhonchi. No retractions. No resp. distress. No accessory muscle use. ABD: S, NT, ND, +BS. No rebound. No HSM. EXTR: No c/c/e NEURO Normal gait. Normal strength, sensation and DTR all extremities,   PSYCH: Normally interactive. Conversant. Not depressed or anxious appearing.  Calm demeanor.   EKG: SR with slighty long QTc.  No concerning ST changes  [facility] reading (PRIMARY) by  Dr. Patsy Lageropland. CXR: negative  CHEST 2 VIEW  COMPARISON: 07/05/2012  FINDINGS:  Cardiac silhouette is normal in size. Normal mediastinal and hilar  contours.  Lungs are mildly hyperexpanded but clear. No pleural effusion. No  pneumothorax.  The bony thorax is demineralized but grossly intact.  IMPRESSION:  No active cardiopulmonary disease.    Results for orders placed in visit on 12/07/13  D-DIMER, QUANTITATIVE      Result Value Ref Range   D-Dimer, Quant 1.07 (*) 0.00 - 0.48 ug/mL-FEU  TROPONIN I      Result Value Ref Range   Troponin I 0.01  <0.06 ng/mL    Assessment and Plan: SOB (shortness of breath) - Plan: DG Chest 2 View, CBC with Differential,  Comprehensive metabolic panel, D-dimer, quantitative, Troponin I  Discussed in detail with Nedra HaiLee.  At this time she is feeling better.  Suspect her SOB was due to a panic attach.  However she did recently have a hip replacement, so a PE is also a consideration.  Decided to do a D dimer - she understands that CT angio will be needed if positive.  Also will do a troponin stat to help us rule out any myocardiac ischemia.   Signed Abbe AmsterdamJessica Jermon Chalfant, MD  Received D  dimer results- high. Troponin is negative. She will need to have a Ct angiogram to rule out PE Received her CT angio result- it is negative for clot.  Will plan a repeat CT in 6 months as per recommendations below- she is a non- smoker  She will let me know if not continuing to feel better over the next few days- Sooner if worse.   IMPRESSION:  1. No evidence for acute pulmonary embolus.  2. Right lower lobe pulmonary nodule measures 7 mm. If the patient  is at high risk for bronchogenic carcinoma, follow-up chest CT at  3-84months is recommended. If the patient is at low risk for  bronchogenic carcinoma, follow-up chest CT at 6-12 months is  recommended. This recommendation follows the consensus statement:  Guidelines for Management of Small Pulmonary Nodules Detected on CT  Scans: A Statement from the Fleischner Society as published in  Radiology 2005; 237:395-400.  3. Atherosclerosis.   Called to check on her 8/7: she is feeling fine.

## 2013-12-07 NOTE — Patient Instructions (Signed)
I will give you a call with your labs in a few hours.  If the D dimer test is positive we will send you for a CT scan to make sure there is no blood clot in your lungs. If your symptoms come back or get worse in the meantime please come back or go to the ER.

## 2013-12-07 NOTE — Telephone Encounter (Signed)
Please review imaging.

## 2013-12-08 ENCOUNTER — Encounter: Payer: Self-pay | Admitting: Family Medicine

## 2013-12-08 DIAGNOSIS — Z5189 Encounter for other specified aftercare: Secondary | ICD-10-CM | POA: Diagnosis not present

## 2013-12-08 DIAGNOSIS — Z471 Aftercare following joint replacement surgery: Secondary | ICD-10-CM | POA: Diagnosis not present

## 2013-12-12 DIAGNOSIS — Z471 Aftercare following joint replacement surgery: Secondary | ICD-10-CM | POA: Diagnosis not present

## 2013-12-12 DIAGNOSIS — Z5189 Encounter for other specified aftercare: Secondary | ICD-10-CM | POA: Diagnosis not present

## 2013-12-14 DIAGNOSIS — Z471 Aftercare following joint replacement surgery: Secondary | ICD-10-CM | POA: Diagnosis not present

## 2013-12-14 DIAGNOSIS — Z5189 Encounter for other specified aftercare: Secondary | ICD-10-CM | POA: Diagnosis not present

## 2013-12-19 DIAGNOSIS — Z471 Aftercare following joint replacement surgery: Secondary | ICD-10-CM | POA: Diagnosis not present

## 2013-12-19 DIAGNOSIS — Z5189 Encounter for other specified aftercare: Secondary | ICD-10-CM | POA: Diagnosis not present

## 2013-12-21 DIAGNOSIS — Z471 Aftercare following joint replacement surgery: Secondary | ICD-10-CM | POA: Diagnosis not present

## 2013-12-21 DIAGNOSIS — Z5189 Encounter for other specified aftercare: Secondary | ICD-10-CM | POA: Diagnosis not present

## 2013-12-22 ENCOUNTER — Telehealth: Payer: Self-pay

## 2013-12-22 NOTE — Telephone Encounter (Signed)
Dr. Copland please advise 

## 2013-12-22 NOTE — Telephone Encounter (Signed)
Pt is moving in with a family member due to several health issues and being in need of extra help. She is breaking the lease at her current apartment and the landlord is requesting a note from the last physician she saw stating the need for Ruth Ford to receive help with her health issues.

## 2013-12-25 NOTE — Telephone Encounter (Signed)
Please see previous note.

## 2013-12-25 NOTE — Telephone Encounter (Signed)
Please be advises note is no longer needed,it was written by ortho   Best phone for any questions [phone]

## 2013-12-26 DIAGNOSIS — Z5189 Encounter for other specified aftercare: Secondary | ICD-10-CM | POA: Diagnosis not present

## 2013-12-26 DIAGNOSIS — Z471 Aftercare following joint replacement surgery: Secondary | ICD-10-CM | POA: Diagnosis not present

## 2013-12-28 DIAGNOSIS — Z5189 Encounter for other specified aftercare: Secondary | ICD-10-CM | POA: Diagnosis not present

## 2013-12-28 DIAGNOSIS — Z471 Aftercare following joint replacement surgery: Secondary | ICD-10-CM | POA: Diagnosis not present

## 2014-02-06 DIAGNOSIS — Z6826 Body mass index (BMI) 26.0-26.9, adult: Secondary | ICD-10-CM | POA: Diagnosis not present

## 2014-02-06 DIAGNOSIS — Z23 Encounter for immunization: Secondary | ICD-10-CM | POA: Diagnosis not present

## 2014-02-06 DIAGNOSIS — G894 Chronic pain syndrome: Secondary | ICD-10-CM | POA: Diagnosis not present

## 2014-02-06 DIAGNOSIS — F419 Anxiety disorder, unspecified: Secondary | ICD-10-CM | POA: Diagnosis not present

## 2014-02-06 DIAGNOSIS — E663 Overweight: Secondary | ICD-10-CM | POA: Diagnosis not present

## 2014-07-06 DIAGNOSIS — H40053 Ocular hypertension, bilateral: Secondary | ICD-10-CM | POA: Diagnosis not present

## 2014-08-07 DIAGNOSIS — H40053 Ocular hypertension, bilateral: Secondary | ICD-10-CM | POA: Diagnosis not present

## 2014-09-04 DIAGNOSIS — H40053 Ocular hypertension, bilateral: Secondary | ICD-10-CM | POA: Diagnosis not present

## 2014-10-11 DIAGNOSIS — I10 Essential (primary) hypertension: Secondary | ICD-10-CM | POA: Diagnosis not present

## 2014-10-11 DIAGNOSIS — E782 Mixed hyperlipidemia: Secondary | ICD-10-CM | POA: Diagnosis not present

## 2014-10-11 DIAGNOSIS — Z0000 Encounter for general adult medical examination without abnormal findings: Secondary | ICD-10-CM | POA: Diagnosis not present

## 2014-10-11 DIAGNOSIS — Z1389 Encounter for screening for other disorder: Secondary | ICD-10-CM | POA: Diagnosis not present

## 2014-12-05 DIAGNOSIS — H40013 Open angle with borderline findings, low risk, bilateral: Secondary | ICD-10-CM | POA: Diagnosis not present

## 2015-01-31 DIAGNOSIS — Z23 Encounter for immunization: Secondary | ICD-10-CM | POA: Diagnosis not present

## 2015-02-07 DIAGNOSIS — H40053 Ocular hypertension, bilateral: Secondary | ICD-10-CM | POA: Diagnosis not present

## 2015-02-19 DIAGNOSIS — H40053 Ocular hypertension, bilateral: Secondary | ICD-10-CM | POA: Diagnosis not present

## 2015-02-20 ENCOUNTER — Encounter: Payer: Self-pay | Admitting: Family Medicine

## 2015-03-13 DIAGNOSIS — H40053 Ocular hypertension, bilateral: Secondary | ICD-10-CM | POA: Diagnosis not present

## 2015-03-21 DIAGNOSIS — H40013 Open angle with borderline findings, low risk, bilateral: Secondary | ICD-10-CM | POA: Diagnosis not present

## 2015-04-18 DIAGNOSIS — R05 Cough: Secondary | ICD-10-CM | POA: Diagnosis not present

## 2015-04-18 DIAGNOSIS — J069 Acute upper respiratory infection, unspecified: Secondary | ICD-10-CM | POA: Diagnosis not present

## 2015-05-01 DIAGNOSIS — H40053 Ocular hypertension, bilateral: Secondary | ICD-10-CM | POA: Diagnosis not present

## 2015-07-08 DIAGNOSIS — H40053 Ocular hypertension, bilateral: Secondary | ICD-10-CM | POA: Diagnosis not present

## 2015-10-07 DIAGNOSIS — H40013 Open angle with borderline findings, low risk, bilateral: Secondary | ICD-10-CM | POA: Diagnosis not present

## 2015-10-11 DIAGNOSIS — M722 Plantar fascial fibromatosis: Secondary | ICD-10-CM | POA: Diagnosis not present

## 2015-10-11 DIAGNOSIS — M7732 Calcaneal spur, left foot: Secondary | ICD-10-CM | POA: Diagnosis not present

## 2015-10-11 DIAGNOSIS — M71572 Other bursitis, not elsewhere classified, left ankle and foot: Secondary | ICD-10-CM | POA: Diagnosis not present

## 2015-10-18 DIAGNOSIS — M722 Plantar fascial fibromatosis: Secondary | ICD-10-CM | POA: Diagnosis not present

## 2015-10-18 DIAGNOSIS — M71572 Other bursitis, not elsewhere classified, left ankle and foot: Secondary | ICD-10-CM | POA: Diagnosis not present

## 2015-10-25 DIAGNOSIS — M71572 Other bursitis, not elsewhere classified, left ankle and foot: Secondary | ICD-10-CM | POA: Diagnosis not present

## 2015-10-25 DIAGNOSIS — M722 Plantar fascial fibromatosis: Secondary | ICD-10-CM | POA: Diagnosis not present

## 2015-12-27 ENCOUNTER — Other Ambulatory Visit: Payer: Self-pay

## 2016-01-07 DIAGNOSIS — H40013 Open angle with borderline findings, low risk, bilateral: Secondary | ICD-10-CM | POA: Diagnosis not present

## 2016-01-24 DIAGNOSIS — Z23 Encounter for immunization: Secondary | ICD-10-CM | POA: Diagnosis not present

## 2016-04-06 ENCOUNTER — Encounter (HOSPITAL_COMMUNITY): Payer: Self-pay | Admitting: Emergency Medicine

## 2016-04-06 ENCOUNTER — Emergency Department (HOSPITAL_COMMUNITY)
Admission: EM | Admit: 2016-04-06 | Discharge: 2016-04-07 | Disposition: A | Discharge status: Home / Self Care | Payer: Medicare Other | Attending: Emergency Medicine | Admitting: Emergency Medicine

## 2016-04-06 DIAGNOSIS — B001 Herpesviral vesicular dermatitis: Secondary | ICD-10-CM | POA: Insufficient documentation

## 2016-04-06 DIAGNOSIS — Z96643 Presence of artificial hip joint, bilateral: Secondary | ICD-10-CM | POA: Insufficient documentation

## 2016-04-06 DIAGNOSIS — I10 Essential (primary) hypertension: Secondary | ICD-10-CM | POA: Diagnosis not present

## 2016-04-06 DIAGNOSIS — I160 Hypertensive urgency: Secondary | ICD-10-CM | POA: Diagnosis not present

## 2016-04-06 DIAGNOSIS — Z79899 Other long term (current) drug therapy: Secondary | ICD-10-CM | POA: Diagnosis not present

## 2016-04-06 DIAGNOSIS — R600 Localized edema: Secondary | ICD-10-CM | POA: Insufficient documentation

## 2016-04-06 DIAGNOSIS — R609 Edema, unspecified: Secondary | ICD-10-CM

## 2016-04-06 DIAGNOSIS — R509 Fever, unspecified: Secondary | ICD-10-CM | POA: Diagnosis not present

## 2016-04-06 MED ORDER — VALACYCLOVIR HCL 1 G PO TABS: 2000.0000 mg | ORAL_TABLET | Freq: Two times a day (BID) | ORAL | 0 refills | Status: DC

## 2016-04-06 MED ORDER — HYDROCHLOROTHIAZIDE 25 MG PO TABS: 25.0000 mg | ORAL_TABLET | Freq: Every day | ORAL | 0 refills | Status: AC

## 2016-04-06 NOTE — Discharge Instructions (Signed)
Stay on a low salt (low sodium) diet.  Monitor your blood pressure at home. Keep a record of it, and take that with you when you see  your doctor.

## 2016-04-06 NOTE — ED Notes (Signed)
Pt sitting on edge of bed when I walked into room.  A & O, c/o high blood pressure that she noticed when she had bp taken.  Pt reports she has not taken her hctz because her dr told her not to take it.  She has no other complaints.  Dr Preston FleetingGlick in room examining and speaking to pt.

## 2016-04-06 NOTE — ED Provider Notes (Signed)
[facility]-EMERGENCY DEPT Provider Note   CSN: [id number] Arrival date & time: 04/06/16  1633     History   Chief Complaint Chief Complaint  Patient presents with  . Hypertension    HPI Ruth Ford is a 82 y.o. female.  She went to her physician to get a refill of valacyclovir, and blood pressure was noted to be elevated to 180 systolic and she was told to come to the ED. She last checked her blood pressure about 3 days ago at which time she thinks it was in the 130s or 140s systolic. She does check her blood pressure at home, but not on a regular basis. It usually is in the range of 135-145/70. She denies headache, tinnitus, nosebleed.   The history is provided by the patient.  Hypertension     Past Medical History:  Diagnosis Date  . Anxiety    occ  . Arthritis   . Complication of anesthesia    "goes to sleep fast"  . Distal radius fracture, left 07/08/2012  . GERD (gastroesophageal reflux disease)   . Osteoarthritis of right hip 10/17/2013  . Primary localized osteoarthrosis, lower leg, left 07/08/2012  . Shortness of breath     Patient Active Problem List   Diagnosis Date Noted  . Osteoarthritis of right hip 10/17/2013  . Hip arthritis 10/17/2013  . Osteoarthritis of left hip 07/08/2012  . Distal radius fracture, left 07/08/2012    Past Surgical History:  Procedure Laterality Date  . BACK SURGERY    . EYE SURGERY Bilateral    catract  . JOINT REPLACEMENT Left 07/07/12  . OPEN REDUCTION INTERNAL FIXATION (ORIF) DISTAL RADIAL FRACTURE Left 07/07/2012   Procedure: OPEN REDUCTION INTERNAL FIXATION (ORIF) DISTAL RADIAL FRACTURE;  Surgeon: Eulas Post, MD;  Location: [facility] OR;  Service: Orthopedics;  Laterality: Left;  . TOTAL HIP ARTHROPLASTY Left 07/07/2012   Procedure: TOTAL HIP ARTHROPLASTY;  Surgeon: Eulas Post, MD;  Location: [facility] OR;  Service: Orthopedics;  Laterality: Left;  . TOTAL HIP ARTHROPLASTY Right 10/17/2013   Procedure: TOTAL HIP ARTHROPLASTY;  Surgeon:  Eulas Post, MD;  Location: [facility] OR;  Service: Orthopedics;  Laterality: Right;    OB History    No data available       Home Medications    Prior to Admission medications   Medication Sig Start Date End Date Taking? Authorizing Provider  baclofen (LIORESAL) 10 MG tablet Take 1 tablet (10 mg total) by mouth 3 (three) times daily. As needed for muscle spasm 10/17/13   Teryl Lucy, MD  famotidine-calcium carbonate-magnesium hydroxide (PEPCID COMPLETE) 10-800-165 MG CHEW Chew 1 tablet by mouth daily as needed (indigestion).     Historical Provider, MD  fish oil-omega-3 fatty acids 1000 MG capsule Take 1 g by mouth daily.     Historical Provider, MD  hydrochlorothiazide (HYDRODIURIL) 25 MG tablet Take 25 mg by mouth daily.    Historical Provider, MD  hydrochlorothiazide (HYDRODIURIL) 25 MG tablet Take 1 tablet (25 mg total) by mouth daily. 04/06/16   Dione Booze, MD  HYDROmorphone (DILAUDID) 2 MG tablet Take 1 tablet (2 mg total) by mouth every 6 (six) hours as needed for severe pain. 10/20/13   Sheral Apley, MD  LORazepam (ATIVAN) 0.5 MG tablet Take 0.5 mg by mouth every 8 (eight) hours.    Historical Provider, MD  ondansetron (ZOFRAN) 4 MG tablet Take 1 tablet (4 mg total) by mouth every 8 (eight) hours as needed for nausea or vomiting. 10/17/13  Teryl LucyJoshua Landau, MD  oxyCODONE-acetaminophen (PERCOCET) 10-325 MG per tablet Take 1-2 tablets by mouth every 6 (six) hours as needed for pain. MAXIMUM TOTAL ACETAMINOPHEN DOSE IS 4000 MG PER DAY 10/17/13   Teryl LucyJoshua Landau, MD  sennosides-docusate sodium (SENOKOT-S) 8.6-50 MG tablet Take 1 tablet by mouth daily. 07/11/12   Teryl LucyJoshua Landau, MD  valACYclovir (VALTREX) 1000 MG tablet Take 2 tablets (2,000 mg total) by mouth 2 (two) times daily. Take two tablets at a time twice a day on the day you notice the fever blister 04/06/16   Dione Boozeavid Maurice Fotheringham, MD    Family History No family history on file.  Social History Social History  Substance Use Topics  .  Smoking status: Never Smoker  . Smokeless tobacco: Never Used  . Alcohol use No     Allergies   Patient has no known allergies.   Review of Systems Review of Systems  All other systems reviewed and are negative.    Physical Exam Updated Vital Signs BP 157/78 (BP Location: Right Arm)   Pulse 83   Temp 97.7 F (36.5 C) (Oral)   Resp 16   Ht 5\' 5"  (1.651 m)   Wt 184 lb 9.6 oz (83.7 kg)   SpO2 98%   BMI 30.72 kg/m   Physical Exam  Nursing note and vitals reviewed.  82 year old female, resting comfortably and in no acute distress. Vital signs are significant for hypertension. Oxygen saturation is 98%, which is normal. Head is normocephalic and atraumatic. PERRLA, EOMI. Oropharynx is clear. Lesions on the upper lip present consistent with fever blister. Neck is nontender and supple without adenopathy or JVD. Back is nontender and there is no CVA tenderness. Lungs are clear without rales, wheezes, or rhonchi. Chest is nontender. Heart has regular rate and rhythm without murmur. Abdomen is soft, flat, nontender without masses or hepatosplenomegaly and peristalsis is normoactive. Extremities have trace edema, full range of motion is present. Skin is warm and dry without other rash. Neurologic: Mental status is normal, cranial nerves are intact, there are no motor or sensory deficits.  ED Treatments / Results   Procedures Procedures (including critical care time)  Medications Ordered in ED Medications - No data to display   Initial Impression / Assessment and Plan / ED Course  I have reviewed the triage vital signs and the nursing notes.   Clinical Course    Essential hypertension with high readings today but no evidence of hypertensive urgency. Old records are reviewed, and she does have ED visits with readings of elevated blood pressure. Her medication list states that she is supposed to be taking hydrochlorothiazide, but patient states that she is not taking it.  Since she does have peripheral edema, I am advising her to resume taking it and she's given a new prescription. She is advised to keep a record of her blood pressure at home and take that with her when she sees her PCP. She's given a refill for valacyclovir to treat her fever blisters.  Final Clinical Impressions(s) / ED Diagnoses   Final diagnoses:  Essential hypertension  Peripheral edema  Fever blister    New Prescriptions New Prescriptions   HYDROCHLOROTHIAZIDE (HYDRODIURIL) 25 MG TABLET    Take 1 tablet (25 mg total) by mouth daily.   VALACYCLOVIR (VALTREX) 1000 MG TABLET    Take 2 tablets (2,000 mg total) by mouth 2 (two) times daily. Take two tablets at a time twice a day on the day you notice the fever blister  Dione Boozeavid Oluwatomisin Deman, MD 04/06/16 [id number]

## 2016-04-06 NOTE — ED Triage Notes (Addendum)
Pt went to see her pcp to have a prescription filled and her bp was 191/101.  Here her bp is 183/89.  Pt denies headache, dizziness or any other symptoms.  Pt states she is highly anxious at this time.  Denies hx of htn.  Pt has been taking benadryl the last few days for hives.

## 2016-04-07 DIAGNOSIS — H40053 Ocular hypertension, bilateral: Secondary | ICD-10-CM | POA: Diagnosis not present

## 2016-04-17 DIAGNOSIS — E782 Mixed hyperlipidemia: Secondary | ICD-10-CM | POA: Diagnosis not present

## 2016-04-17 DIAGNOSIS — R7309 Other abnormal glucose: Secondary | ICD-10-CM | POA: Diagnosis not present

## 2016-04-17 DIAGNOSIS — Z1389 Encounter for screening for other disorder: Secondary | ICD-10-CM | POA: Diagnosis not present

## 2016-04-17 DIAGNOSIS — Z6830 Body mass index (BMI) 30.0-30.9, adult: Secondary | ICD-10-CM | POA: Diagnosis not present

## 2016-04-17 DIAGNOSIS — I10 Essential (primary) hypertension: Secondary | ICD-10-CM | POA: Diagnosis not present

## 2016-07-07 DIAGNOSIS — H40023 Open angle with borderline findings, high risk, bilateral: Secondary | ICD-10-CM | POA: Diagnosis not present

## 2016-07-20 DIAGNOSIS — Z1389 Encounter for screening for other disorder: Secondary | ICD-10-CM | POA: Diagnosis not present

## 2016-07-20 DIAGNOSIS — I10 Essential (primary) hypertension: Secondary | ICD-10-CM | POA: Diagnosis not present

## 2016-07-20 DIAGNOSIS — J301 Allergic rhinitis due to pollen: Secondary | ICD-10-CM | POA: Diagnosis not present

## 2016-07-20 DIAGNOSIS — Z6830 Body mass index (BMI) 30.0-30.9, adult: Secondary | ICD-10-CM | POA: Diagnosis not present

## 2016-07-20 DIAGNOSIS — E6609 Other obesity due to excess calories: Secondary | ICD-10-CM | POA: Diagnosis not present

## 2016-07-20 DIAGNOSIS — E782 Mixed hyperlipidemia: Secondary | ICD-10-CM | POA: Diagnosis not present

## 2016-10-07 DIAGNOSIS — H40013 Open angle with borderline findings, low risk, bilateral: Secondary | ICD-10-CM | POA: Diagnosis not present

## 2017-01-05 DIAGNOSIS — H40053 Ocular hypertension, bilateral: Secondary | ICD-10-CM | POA: Diagnosis not present

## 2017-02-16 DIAGNOSIS — E782 Mixed hyperlipidemia: Secondary | ICD-10-CM | POA: Diagnosis not present

## 2017-02-16 DIAGNOSIS — Z23 Encounter for immunization: Secondary | ICD-10-CM | POA: Diagnosis not present

## 2017-02-16 DIAGNOSIS — Z6831 Body mass index (BMI) 31.0-31.9, adult: Secondary | ICD-10-CM | POA: Diagnosis not present

## 2017-02-16 DIAGNOSIS — Z1389 Encounter for screening for other disorder: Secondary | ICD-10-CM | POA: Diagnosis not present

## 2017-02-16 DIAGNOSIS — M169 Osteoarthritis of hip, unspecified: Secondary | ICD-10-CM | POA: Diagnosis not present

## 2017-02-16 DIAGNOSIS — R7309 Other abnormal glucose: Secondary | ICD-10-CM | POA: Diagnosis not present

## 2017-02-16 DIAGNOSIS — I10 Essential (primary) hypertension: Secondary | ICD-10-CM | POA: Diagnosis not present

## 2017-03-14 DIAGNOSIS — R300 Dysuria: Secondary | ICD-10-CM | POA: Diagnosis not present

## 2017-03-14 DIAGNOSIS — I160 Hypertensive urgency: Secondary | ICD-10-CM | POA: Diagnosis not present

## 2017-03-14 DIAGNOSIS — R030 Elevated blood-pressure reading, without diagnosis of hypertension: Secondary | ICD-10-CM | POA: Diagnosis not present

## 2017-03-15 DIAGNOSIS — R300 Dysuria: Secondary | ICD-10-CM | POA: Diagnosis not present

## 2017-04-07 DIAGNOSIS — H40053 Ocular hypertension, bilateral: Secondary | ICD-10-CM | POA: Diagnosis not present

## 2017-06-14 DIAGNOSIS — I160 Hypertensive urgency: Secondary | ICD-10-CM | POA: Diagnosis not present

## 2017-06-14 DIAGNOSIS — J069 Acute upper respiratory infection, unspecified: Secondary | ICD-10-CM | POA: Diagnosis not present

## 2017-06-14 DIAGNOSIS — R42 Dizziness and giddiness: Secondary | ICD-10-CM | POA: Diagnosis not present

## 2017-06-17 DIAGNOSIS — H6593 Unspecified nonsuppurative otitis media, bilateral: Secondary | ICD-10-CM | POA: Diagnosis not present

## 2017-06-17 DIAGNOSIS — I160 Hypertensive urgency: Secondary | ICD-10-CM | POA: Diagnosis not present

## 2017-06-17 DIAGNOSIS — R42 Dizziness and giddiness: Secondary | ICD-10-CM | POA: Diagnosis not present

## 2017-06-25 ENCOUNTER — Emergency Department (HOSPITAL_COMMUNITY)
Admission: EM | Admit: 2017-06-25 | Discharge: 2017-06-26 | Disposition: A | Discharge status: Home / Self Care | Payer: Medicare Other | Attending: Emergency Medicine | Admitting: Emergency Medicine

## 2017-06-25 DIAGNOSIS — R42 Dizziness and giddiness: Secondary | ICD-10-CM | POA: Diagnosis not present

## 2017-06-25 DIAGNOSIS — Z79899 Other long term (current) drug therapy: Secondary | ICD-10-CM | POA: Diagnosis not present

## 2017-06-25 DIAGNOSIS — Z96643 Presence of artificial hip joint, bilateral: Secondary | ICD-10-CM | POA: Diagnosis not present

## 2017-06-25 DIAGNOSIS — F419 Anxiety disorder, unspecified: Secondary | ICD-10-CM | POA: Insufficient documentation

## 2017-06-25 DIAGNOSIS — F23 Brief psychotic disorder: Secondary | ICD-10-CM | POA: Diagnosis not present

## 2017-06-25 NOTE — ED Notes (Signed)
Bed: [facility]: [facility] Expected date:  Expected time:  Means of arrival:  Comments: EMS 84 yo female anxiety

## 2017-06-25 NOTE — ED Triage Notes (Signed)
Pt comes to ed, via ems, pt had increased anxiety. building up over the last two days.  V/s on arrival 158/78, pluses 80, SpO2 98, temp 97.9. cbg 102.

## 2017-06-26 DIAGNOSIS — R42 Dizziness and giddiness: Secondary | ICD-10-CM | POA: Diagnosis not present

## 2017-06-26 DIAGNOSIS — F419 Anxiety disorder, unspecified: Secondary | ICD-10-CM | POA: Diagnosis not present

## 2017-06-26 DIAGNOSIS — I160 Hypertensive urgency: Secondary | ICD-10-CM | POA: Diagnosis not present

## 2017-06-26 LAB — BASIC METABOLIC PANEL
Anion gap: 13 (ref 5–15)
BUN: 20 mg/dL (ref 6–20)
CALCIUM: 9.9 mg/dL (ref 8.9–10.3)
CO2: 22 mmol/L (ref 22–32)
CREATININE: 1.22 mg/dL — AB (ref 0.44–1.00)
Chloride: 103 mmol/L (ref 101–111)
GFR calc non Af Amer: 40 mL/min — ABNORMAL LOW (ref >60)
GFR, EST AFRICAN AMERICAN: 47 mL/min — AB (ref >60)
Glucose, Bld: 129 mg/dL — ABNORMAL HIGH (ref 65–99)
Potassium: 3.8 mmol/L (ref 3.5–5.1)
Sodium: 138 mmol/L (ref 135–145)

## 2017-06-26 LAB — CBC WITH DIFFERENTIAL/PLATELET
BASOS PCT: 0 %
Basophils Absolute: 0 10*3/uL (ref 0.0–0.1)
EOS ABS: 0.5 10*3/uL (ref 0.0–0.7)
EOS PCT: 5 %
HCT: 51.2 % — ABNORMAL HIGH (ref 36.0–46.0)
HEMOGLOBIN: 16.9 g/dL — AB (ref 12.0–15.0)
Lymphocytes Relative: 20 %
Lymphs Abs: 2.2 10*3/uL (ref 0.7–4.0)
MCH: 31.5 pg (ref 26.0–34.0)
MCHC: 33 g/dL (ref 30.0–36.0)
MCV: 95.3 fL (ref 78.0–100.0)
Monocytes Absolute: 0.9 10*3/uL (ref 0.1–1.0)
Monocytes Relative: 8 %
NEUTROS PCT: 67 %
Neutro Abs: 7.3 10*3/uL (ref 1.7–7.7)
Platelets: 286 10*3/uL (ref 150–400)
RBC: 5.37 MIL/uL — ABNORMAL HIGH (ref 3.87–5.11)
RDW: 14.1 % (ref 11.5–15.5)
WBC: 11 10*3/uL — AB (ref 4.0–10.5)

## 2017-06-26 LAB — URINALYSIS, ROUTINE W REFLEX MICROSCOPIC
Bilirubin Urine: NEGATIVE
Glucose, UA: NEGATIVE mg/dL
HGB URINE DIPSTICK: NEGATIVE
Ketones, ur: NEGATIVE mg/dL
Leukocytes, UA: NEGATIVE
NITRITE: NEGATIVE
PROTEIN: NEGATIVE mg/dL
SPECIFIC GRAVITY, URINE: 1.006 (ref 1.005–1.030)
pH: 7 (ref 5.0–8.0)

## 2017-06-26 MED ORDER — MECLIZINE HCL 25 MG PO TABS
25.0000 mg | ORAL_TABLET | Freq: Once | ORAL | Status: AC
  Administered 2017-06-26: 25 mg via ORAL
  Filled 2017-06-26: qty 1

## 2017-06-26 MED ORDER — LORAZEPAM 0.5 MG PO TABS
0.5000 mg | ORAL_TABLET | Freq: Once | ORAL | Status: AC
  Administered 2017-06-26: 0.5 mg via ORAL
  Filled 2017-06-26: qty 1

## 2017-06-26 MED ORDER — SODIUM CHLORIDE 0.9 % IV BOLUS (SEPSIS)
1000.0000 mL | Freq: Once | INTRAVENOUS | Status: AC
  Administered 2017-06-26: 1000 mL via INTRAVENOUS

## 2017-06-26 NOTE — Discharge Instructions (Signed)
Get help right away if: °You have chest pain. °You have a fast or irregular heartbeat. °You develop numbness in any part of your body. °You cannot move your arms or your legs. °You have trouble speaking. °You become sweaty or feel lightheaded. °You faint. °You feel short of breath. °You have trouble staying awake. °You feel confused. °

## 2017-06-26 NOTE — ED Provider Notes (Signed)
[facility]-EMERGENCY DEPT Provider Note   CSN: [id number] Arrival date & time: 06/25/17  2252     History   Chief Complaint Chief Complaint  Patient presents with  . Anxiety    HPI Ruth Ford is a 84 y.o. female who presents the emergency department chief complaint of dizziness.  She has had about 1 month of dizziness which she describes as feeling lightheaded when she goes from a sitting position to standing position.  She is a history of the anxiety and this is making her feel extremely anxious because she has not had it before.  She denies vertiginous symptoms or disequilibrium.  Patient states that she recently had a very bad URI and she feels like her lightheadedness has worsened.  She denies abdominal pain, nausea, vomiting, burning with urination, fevers or chills.  HPI  Past Medical History:  Diagnosis Date  . Anxiety    occ  . Arthritis   . Complication of anesthesia    "goes to sleep fast"  . Distal radius fracture, left 07/08/2012  . GERD (gastroesophageal reflux disease)   . Osteoarthritis of right hip 10/17/2013  . Primary localized osteoarthrosis, lower leg, left 07/08/2012  . Shortness of breath     Patient Active Problem List   Diagnosis Date Noted  . Osteoarthritis of right hip 10/17/2013  . Hip arthritis 10/17/2013  . Osteoarthritis of left hip 07/08/2012  . Distal radius fracture, left 07/08/2012    Past Surgical History:  Procedure Laterality Date  . BACK SURGERY    . EYE SURGERY Bilateral    catract  . JOINT REPLACEMENT Left 07/07/12  . OPEN REDUCTION INTERNAL FIXATION (ORIF) DISTAL RADIAL FRACTURE Left 07/07/2012   Procedure: OPEN REDUCTION INTERNAL FIXATION (ORIF) DISTAL RADIAL FRACTURE;  Surgeon: Eulas Post, MD;  Location: [facility] OR;  Service: Orthopedics;  Laterality: Left;  . TOTAL HIP ARTHROPLASTY Left 07/07/2012   Procedure: TOTAL HIP ARTHROPLASTY;  Surgeon: Eulas Post, MD;  Location: [facility] OR;  Service: Orthopedics;   Laterality: Left;  . TOTAL HIP ARTHROPLASTY Right 10/17/2013   Procedure: TOTAL HIP ARTHROPLASTY;  Surgeon: Eulas Post, MD;  Location: [facility] OR;  Service: Orthopedics;  Laterality: Right;    OB History    No data available       Home Medications    Prior to Admission medications   Medication Sig Start Date End Date Taking? Authorizing Provider  baclofen (LIORESAL) 10 MG tablet Take 1 tablet (10 mg total) by mouth 3 (three) times daily. As needed for muscle spasm 10/17/13   Teryl Lucy, MD  famotidine-calcium carbonate-magnesium hydroxide (PEPCID COMPLETE) 10-800-165 MG CHEW Chew 1 tablet by mouth daily as needed (indigestion).     [provider]  fish oil-omega-3 fatty acids 1000 MG capsule Take 1 g by mouth daily.     [provider]  hydrochlorothiazide (HYDRODIURIL) 25 MG tablet Take 25 mg by mouth daily.    [provider]  hydrochlorothiazide (HYDRODIURIL) 25 MG tablet Take 1 tablet (25 mg total) by mouth daily. 04/06/16   Dione Booze, MD  HYDROmorphone (DILAUDID) 2 MG tablet Take 1 tablet (2 mg total) by mouth every 6 (six) hours as needed for severe pain. 10/20/13   Sheral Apley, MD  LORazepam (ATIVAN) 0.5 MG tablet Take 0.5 mg by mouth every 8 (eight) hours.    [provider]  ondansetron (ZOFRAN) 4 MG tablet Take 1 tablet (4 mg total) by mouth every 8 (eight) hours as needed  for nausea or vomiting. 10/17/13   Teryl Lucy, MD  oxyCODONE-acetaminophen (PERCOCET) 10-325 MG per tablet Take 1-2 tablets by mouth every 6 (six) hours as needed for pain. MAXIMUM TOTAL ACETAMINOPHEN DOSE IS 4000 MG PER DAY 10/17/13   Teryl Lucy, MD  sennosides-docusate sodium (SENOKOT-S) 8.6-50 MG tablet Take 1 tablet by mouth daily. 07/11/12   Teryl Lucy, MD  valACYclovir (VALTREX) 1000 MG tablet Take 2 tablets (2,000 mg total) by mouth 2 (two) times daily. Take two tablets at a time twice a day on the day you notice the fever blister 04/06/16   Dione Booze, MD    Family History No family history on file.  Social History Social History   Tobacco Use  . Smoking status: Never Smoker  . Smokeless tobacco: Never Used  Substance Use Topics  . Alcohol use: No  . Drug use: No     Allergies   Patient has no known allergies.   Review of Systems Review of Systems  Ten systems reviewed and are negative for acute change, except as noted in the HPI.   Physical Exam Updated Vital Signs BP (!) 162/87 (BP Location: Right Arm)   Pulse 85   Temp 97.6 F (36.4 C) (Oral)   Resp 16   Ht 5' 5.5" (1.664 m)   Wt 79.8 kg (176 lb)   SpO2 94%   BMI 28.84 kg/m   Physical Exam Physical Exam  Nursing note and vitals reviewed. Constitutional: She is oriented to person, place, and time. She appears well-developed and well-nourished. No distress.  HENT:  Head: Normocephalic and atraumatic.  Eyes: Conjunctivae normal and EOM are normal. Pupils are equal, round, and reactive to light. No scleral icterus.  Neck: Normal range of motion.  Cardiovascular: Normal rate, regular rhythm and normal heart sounds.  Exam reveals no gallop and no friction rub.   No murmur heard. Pulmonary/Chest: Effort normal and breath sounds normal. No respiratory distress.  Abdominal: Soft. Bowel sounds are normal. She exhibits no distension and no mass. There is no tenderness. There is no guarding.  Neurological: She is alert and oriented to person, place, and time.  Skin: Skin is warm and dry. She is not diaphoretic.     ED Treatments / Results  Labs (all labs ordered are listed, but only abnormal results are displayed) Labs Reviewed  BASIC METABOLIC PANEL  CBC WITH DIFFERENTIAL/PLATELET  URINALYSIS, ROUTINE W REFLEX MICROSCOPIC    EKG  EKG Interpretation None       Radiology No results found.  Procedures Procedures (including critical care time)  Medications Ordered in ED Medications - No data to display   Initial Impression / Assessment  and Plan / ED Course  I have reviewed the triage vital signs and the nursing notes.  Pertinent labs & imaging results that were available during my care of the patient were reviewed by me and considered in my medical decision making (see chart for details).      patient with negative orthostatic vital signs.  She does appear to be volume depleted.  She is gotten fluids with improvement in her symptoms become continues to complain of anxiety.  Patient advised that this is an outpatient follow-up.  She has been seen and shared visit with Dr. Elesa Massed.  She appears appropriate for discharge at this time.  Labs,  Final Clinical Impressions(s) / ED Diagnoses   Final diagnoses:  Lightheadedness  Anxiety    ED Discharge Orders    None  Arthor CaptainHarris, Chanique Duca, PA-C 06/26/17 0559    Ward, Layla MawKristen N, DO 06/26/17 [phone]

## 2017-06-30 DIAGNOSIS — H6593 Unspecified nonsuppurative otitis media, bilateral: Secondary | ICD-10-CM | POA: Diagnosis not present

## 2017-06-30 DIAGNOSIS — E6609 Other obesity due to excess calories: Secondary | ICD-10-CM | POA: Diagnosis not present

## 2017-06-30 DIAGNOSIS — Z1389 Encounter for screening for other disorder: Secondary | ICD-10-CM | POA: Diagnosis not present

## 2017-06-30 DIAGNOSIS — J069 Acute upper respiratory infection, unspecified: Secondary | ICD-10-CM | POA: Diagnosis not present

## 2017-06-30 DIAGNOSIS — Z6830 Body mass index (BMI) 30.0-30.9, adult: Secondary | ICD-10-CM | POA: Diagnosis not present

## 2017-07-07 DIAGNOSIS — H40053 Ocular hypertension, bilateral: Secondary | ICD-10-CM | POA: Diagnosis not present

## 2017-07-12 ENCOUNTER — Emergency Department (HOSPITAL_COMMUNITY)
Admission: EM | Admit: 2017-07-12 | Discharge: 2017-07-12 | Disposition: A | Discharge status: Home / Self Care | Payer: Medicare Other | Attending: Emergency Medicine | Admitting: Emergency Medicine

## 2017-07-12 ENCOUNTER — Other Ambulatory Visit: Payer: Self-pay

## 2017-07-12 ENCOUNTER — Encounter (HOSPITAL_COMMUNITY): Payer: Self-pay

## 2017-07-12 DIAGNOSIS — R404 Transient alteration of awareness: Secondary | ICD-10-CM | POA: Diagnosis not present

## 2017-07-12 DIAGNOSIS — T380X5A Adverse effect of glucocorticoids and synthetic analogues, initial encounter: Secondary | ICD-10-CM | POA: Diagnosis not present

## 2017-07-12 DIAGNOSIS — Z96643 Presence of artificial hip joint, bilateral: Secondary | ICD-10-CM | POA: Diagnosis not present

## 2017-07-12 DIAGNOSIS — Z79899 Other long term (current) drug therapy: Secondary | ICD-10-CM | POA: Diagnosis not present

## 2017-07-12 DIAGNOSIS — E860 Dehydration: Secondary | ICD-10-CM | POA: Insufficient documentation

## 2017-07-12 DIAGNOSIS — T50905A Adverse effect of unspecified drugs, medicaments and biological substances, initial encounter: Secondary | ICD-10-CM

## 2017-07-12 DIAGNOSIS — F419 Anxiety disorder, unspecified: Secondary | ICD-10-CM | POA: Insufficient documentation

## 2017-07-12 DIAGNOSIS — R55 Syncope and collapse: Secondary | ICD-10-CM | POA: Diagnosis not present

## 2017-07-12 HISTORY — DX: Otitis media, unspecified, unspecified ear: H66.90

## 2017-07-12 HISTORY — DX: Dizziness and giddiness: R42

## 2017-07-12 LAB — CBC
HEMATOCRIT: 48.2 % — AB (ref 36.0–46.0)
Hemoglobin: 16.1 g/dL — ABNORMAL HIGH (ref 12.0–15.0)
MCH: 31.8 pg (ref 26.0–34.0)
MCHC: 33.4 g/dL (ref 30.0–36.0)
MCV: 95.1 fL (ref 78.0–100.0)
PLATELETS: 222 10*3/uL (ref 150–400)
RBC: 5.07 MIL/uL (ref 3.87–5.11)
RDW: 13.9 % (ref 11.5–15.5)
WBC: 8 10*3/uL (ref 4.0–10.5)

## 2017-07-12 LAB — BASIC METABOLIC PANEL
Anion gap: 8 (ref 5–15)
BUN: 18 mg/dL (ref 6–20)
CO2: 27 mmol/L (ref 22–32)
CREATININE: 1.2 mg/dL — AB (ref 0.44–1.00)
Calcium: 10.3 mg/dL (ref 8.9–10.3)
Chloride: 106 mmol/L (ref 101–111)
GFR, EST AFRICAN AMERICAN: 48 mL/min — AB (ref >60)
GFR, EST NON AFRICAN AMERICAN: 41 mL/min — AB (ref >60)
Glucose, Bld: 136 mg/dL — ABNORMAL HIGH (ref 65–99)
POTASSIUM: 3.9 mmol/L (ref 3.5–5.1)
SODIUM: 141 mmol/L (ref 135–145)

## 2017-07-12 LAB — URINALYSIS, ROUTINE W REFLEX MICROSCOPIC
BILIRUBIN URINE: NEGATIVE
Bacteria, UA: NONE SEEN
Glucose, UA: NEGATIVE mg/dL
KETONES UR: NEGATIVE mg/dL
NITRITE: NEGATIVE
PH: 6 (ref 5.0–8.0)
Protein, ur: NEGATIVE mg/dL
SPECIFIC GRAVITY, URINE: 1.009 (ref 1.005–1.030)
Squamous Epithelial / LPF: NONE SEEN

## 2017-07-12 LAB — MAGNESIUM: Magnesium: 1.9 mg/dL (ref 1.7–2.4)

## 2017-07-12 LAB — I-STAT TROPONIN, ED: Troponin i, poc: 0 ng/mL (ref 0.00–0.08)

## 2017-07-12 LAB — CBG MONITORING, ED: Glucose-Capillary: 132 mg/dL — ABNORMAL HIGH (ref 65–99)

## 2017-07-12 LAB — TSH: TSH: 0.519 u[IU]/mL (ref 0.350–4.500)

## 2017-07-12 MED ORDER — LORAZEPAM 0.5 MG PO TABS: 0.5000 mg | ORAL_TABLET | Freq: Two times a day (BID) | ORAL | 0 refills | Status: AC

## 2017-07-12 MED ORDER — SODIUM CHLORIDE 0.9 % IV BOLUS (SEPSIS)
1000.0000 mL | Freq: Once | INTRAVENOUS | Status: AC
  Administered 2017-07-12: 1000 mL via INTRAVENOUS

## 2017-07-12 MED ORDER — LORAZEPAM 2 MG/ML IJ SOLN
1.0000 mg | Freq: Once | INTRAMUSCULAR | Status: AC
  Administered 2017-07-12: 1 mg via INTRAVENOUS
  Filled 2017-07-12: qty 1

## 2017-07-12 NOTE — ED Notes (Signed)
Pt was able to ambulate with writer with no complaints of dizziness.

## 2017-07-12 NOTE — ED Triage Notes (Signed)
Per [facility]- Pt resides at home. Unwitnessed syncopal event. Pt felt "like she was going to pass out" Pt "just sat down" and called niece.  Pt recently seen for Vertigo and inner ear infection. Recent treatment of antibiotics for inner ear. Pt also feels its anxiety related. NEG STROKE, CHEST PAIN. Referral to ENT for inner ear. Denies N/V/D and fever. Pt currently feels anxious.

## 2017-07-12 NOTE — ED Notes (Signed)
Bed: [facility] Expected date:  Expected time:  Means of arrival:  Comments: Triage 3 

## 2017-07-12 NOTE — ED Provider Notes (Signed)
[facility]-EMERGENCY DEPT Provider Note   CSN: [id number] Arrival date & time: 07/12/17  1539     History   Chief Complaint Chief Complaint  Patient presents with  . Near Syncope    HPI Ruth Ford is a 84 y.o. female.  HPI 84 year old female with history of anxiety, vertigo, GERD, here with intermittent episodes for the last several weeks.  The patient symptoms started approximately 3-4 weeks ago.  She has a lifelong history of anxiety but her symptoms started worsening several weeks ago.  At that time, she had a cold and was treated with prednisone and antibiotics.  Since then, she has had increasingly frequent episodes in which she begins to feel very anxious, and "worried about everything."  She then experiences some shaking sensations and tremors, shortness of breath, but no chest pain or palpitations.  These episodes seem to resolve as she is able to calm herself down and sit down.  She has seen her primary care doctor for this.  She is also been referred to an ENT for possible component of vertigo, though she strongly denies any dizziness or lightheadedness.  The symptoms all seem to worsen after steroids.  Denies any SI or HI or AVH.  Denies any coronary history.  Symptoms are worse in the setting of stressors.  No alleviating factors.  Past Medical History:  Diagnosis Date  . Anxiety    occ  . Arthritis   . Complication of anesthesia    "goes to sleep fast"  . Distal radius fracture, left 07/08/2012  . Ear infection   . GERD (gastroesophageal reflux disease)   . Osteoarthritis of right hip 10/17/2013  . Primary localized osteoarthrosis, lower leg, left 07/08/2012  . Shortness of breath   . Vertigo     Patient Active Problem List   Diagnosis Date Noted  . Osteoarthritis of right hip 10/17/2013  . Hip arthritis 10/17/2013  . Osteoarthritis of left hip 07/08/2012  . Distal radius fracture, left 07/08/2012    Past Surgical History:  Procedure  Laterality Date  . BACK SURGERY    . EYE SURGERY Bilateral    catract  . JOINT REPLACEMENT Left 07/07/12  . OPEN REDUCTION INTERNAL FIXATION (ORIF) DISTAL RADIAL FRACTURE Left 07/07/2012   Procedure: OPEN REDUCTION INTERNAL FIXATION (ORIF) DISTAL RADIAL FRACTURE;  Surgeon: Eulas PostJoshua P Landau, MD;  Location: [facility] OR;  Service: Orthopedics;  Laterality: Left;  . TOTAL HIP ARTHROPLASTY Left 07/07/2012   Procedure: TOTAL HIP ARTHROPLASTY;  Surgeon: Eulas PostJoshua P Landau, MD;  Location: [facility] OR;  Service: Orthopedics;  Laterality: Left;  . TOTAL HIP ARTHROPLASTY Right 10/17/2013   Procedure: TOTAL HIP ARTHROPLASTY;  Surgeon: Eulas PostJoshua P Landau, MD;  Location: [facility] OR;  Service: Orthopedics;  Laterality: Right;    OB History    No data available       Home Medications    Prior to Admission medications   Medication Sig Start Date End Date Taking? Authorizing Provider  cetirizine (ZYRTEC) 5 MG tablet Take 5 mg by mouth daily.   Yes [provider]  dorzolamide-timolol (COSOPT) 22.3-6.8 MG/ML ophthalmic solution Place 1 drop into both eyes 2 (two) times daily. 07/04/17  Yes [provider]  famotidine-calcium carbonate-magnesium hydroxide (PEPCID COMPLETE) 10-800-165 MG CHEW Chew 1 tablet by mouth daily as needed (indigestion).    Yes [provider]  fish oil-omega-3 fatty acids 1000 MG capsule Take 1 g by mouth daily.    Yes [provider]  hydrochlorothiazide (HYDRODIURIL) 25  MG tablet Take 1 tablet (25 mg total) by mouth daily. 04/06/16  Yes Dione Booze, MD  pravastatin (PRAVACHOL) 20 MG tablet Take 20 mg by mouth daily. 03/26/17  Yes [provider]  ranitidine (ZANTAC) 150 MG tablet Take 150 mg by mouth daily as needed for heartburn.   Yes [provider]  LORazepam (ATIVAN) 0.5 MG tablet Take 1 tablet (0.5 mg total) by mouth 2 (two) times daily for 7 days. 07/12/17 07/19/17  Shaune Pollack, MD    Family History No family history on file.  Social  History Social History   Tobacco Use  . Smoking status: Never Smoker  . Smokeless tobacco: Never Used  Substance Use Topics  . Alcohol use: No  . Drug use: No     Allergies   Meclizine   Review of Systems Review of Systems  Constitutional: Negative for chills and fever.  HENT: Negative for congestion, rhinorrhea and sore throat.   Eyes: Negative for visual disturbance.  Respiratory: Negative for cough, shortness of breath and wheezing.   Cardiovascular: Negative for chest pain and leg swelling.  Gastrointestinal: Negative for abdominal pain, diarrhea, nausea and vomiting.  Genitourinary: Negative for dysuria, flank pain, vaginal bleeding and vaginal discharge.  Musculoskeletal: Negative for neck pain.  Skin: Negative for rash.  Allergic/Immunologic: Negative for immunocompromised state.  Neurological: Negative for syncope and headaches.  Hematological: Does not bruise/bleed easily.  Psychiatric/Behavioral: The patient is nervous/anxious.   All other systems reviewed and are negative.    Physical Exam Updated Vital Signs BP (!) 141/73   Pulse 84   Temp 98.4 F (36.9 C) (Oral)   Resp 20   Ht 5' 5.5" (1.664 m)   Wt 79.8 kg (176 lb)   SpO2 97%   BMI 28.84 kg/m   Physical Exam  Constitutional: She is oriented to person, place, and time. She appears well-developed and well-nourished. No distress.  HENT:  Head: Normocephalic and atraumatic.  Eyes: Conjunctivae are normal.  Neck: Neck supple.  Cardiovascular: Normal rate, regular rhythm and normal heart sounds. Exam reveals no friction rub.  No murmur heard. Pulmonary/Chest: Effort normal and breath sounds normal. No respiratory distress. She has no wheezes. She has no rales.  Abdominal: She exhibits no distension.  Musculoskeletal: She exhibits no edema.  Neurological: She is alert and oriented to person, place, and time. She exhibits normal muscle tone.  Skin: Skin is warm. Capillary refill takes less than 2  seconds.  Psychiatric: Her mood appears anxious. She is withdrawn.  Nursing note and vitals reviewed.    ED Treatments / Results  Labs (all labs ordered are listed, but only abnormal results are displayed) Labs Reviewed  BASIC METABOLIC PANEL - Abnormal; Notable for the following components:      Result Value   Glucose, Bld 136 (*)    Creatinine, Ser 1.20 (*)    GFR calc non Af Amer 41 (*)    GFR calc Af Amer 48 (*)    All other components within normal limits  CBC - Abnormal; Notable for the following components:   Hemoglobin 16.1 (*)    HCT 48.2 (*)    All other components within normal limits  URINALYSIS, ROUTINE W REFLEX MICROSCOPIC - Abnormal; Notable for the following components:   Color, Urine STRAW (*)    Hgb urine dipstick SMALL (*)    Leukocytes, UA SMALL (*)    All other components within normal limits  CBG MONITORING, ED - Abnormal; Notable for the following  components:   Glucose-Capillary 132 (*)    All other components within normal limits  MAGNESIUM  TSH  I-STAT TROPONIN, ED    EKG  EKG Interpretation  Date/Time:  Monday July 12 2017 17:20:46 EDT Ventricular Rate:  96 PR Interval:    QRS Duration: 92 QT Interval:  356 QTC Calculation: 450 R Axis:   -28 Text Interpretation:  Sinus rhythm Abnormal R-wave progression, early transition LVH by voltage No significant change since last tracing Confirmed by Shaune Pollack [phone]) on 07/12/2017 5:36:25 PM       Radiology No results found.  Procedures Procedures (including critical care time)  Medications Ordered in ED Medications  LORazepam (ATIVAN) injection 1 mg (1 mg Intravenous Given 07/12/17 1800)  sodium chloride 0.9 % bolus 1,000 mL (0 mLs Intravenous Stopped 07/12/17 1900)     Initial Impression / Assessment and Plan / ED Course  I have reviewed the triage vital signs and the nursing notes.  Pertinent labs & imaging results that were available during my care of the patient were reviewed by  me and considered in my medical decision making (see chart for details).     84 year old female here with episodes in which she becomes very anxious and feels like she is shaking.  These episodes are associated with stressors.  I suspect patient is having significantly increased anxiety and panic attacks in the setting of prednisone use.  She has a history of being very sensitive to medications with an extensive previous history of anxiety and I suspect this is worsened her symptoms.  Her symptoms always seem to start in the setting of a stressor and are associated with initial sensation of anxiety and helplessness, with classic symptoms of anxiety.  She strongly denies any chest pain, palpitations, shortness of breath, or symptoms to suggest cardiac etiology.  Her EKG is nonischemic with normal intervals.  She was monitored on telemetry for several hours without any arrhythmia.  Otherwise, lab work is very reassuring.  No evidence of significant electrolyte abnormality.  She has a mild elevation in her hemoglobin which I suspect is due to dehydration but I notified her of this and she will follow-up with her doctor.  Her lungs are completely clear with normal work of breathing.  Thyroid is normal.  Troponin is negative.  No signs to suggest PE.  Patient appears significantly improved with Ativan and has had no further symptoms.  She is able to ambulate without difficulty.  She feels comfortable going home.  I discussed that this could be a side effect of her prednisone, but she should also follow-up with her doctor for further evaluation and workup.  She may ultimately need a Holter monitor as an outpatient if her symptoms do not improve with anxiety treatment, but given absence of any arrhythmia normal EKG here, she is comfortable following up as an outpatient.  I discussed strict return precautions as well as adjusting her medications to avoid any additional medication adverse effects.  Will have her start  taking the Ativan that she has already been prescribed, and have her follow-up with her doctor.  This note was prepared with assistance of Conservation officer, historic buildings. Occasional wrong-word or sound-a-like substitutions may have occurred due to the inherent limitations of voice recognition software.   Final Clinical Impressions(s) / ED Diagnoses   Final diagnoses:  Anxiety  Dehydration  Adverse effect of drug, initial encounter    ED Discharge Orders        Ordered  LORazepam (ATIVAN) 0.5 MG tablet  2 times daily     07/12/17 2004       Shaune Pollack, MD 07/12/17 2012

## 2017-07-12 NOTE — Discharge Instructions (Signed)
I think that some of your symptoms are from side effects of the prednisone, which can worsen anxiety.  I recommend that you STOP taking the Meclizine. Instead, I would try to start taking the Ativan 0.5 mg twice a day, every day, for at least 5-7 days. This can help with anxiety. You should call your doctor as well to set up a follow-up, as you may need stronger medication OR an alternative, safer medication long-term.  I would continue your: Zyrtec Cosopt drops Fish Oil Hydrochlorothiazide Pravastatin Zantac  In addition, start the Ativan twice a day as prescribed.

## 2017-07-15 DIAGNOSIS — R42 Dizziness and giddiness: Secondary | ICD-10-CM | POA: Diagnosis not present

## 2017-07-27 ENCOUNTER — Encounter (HOSPITAL_COMMUNITY): Payer: Self-pay | Admitting: Family Medicine

## 2017-07-27 DIAGNOSIS — Z96643 Presence of artificial hip joint, bilateral: Secondary | ICD-10-CM | POA: Insufficient documentation

## 2017-07-27 DIAGNOSIS — F419 Anxiety disorder, unspecified: Secondary | ICD-10-CM | POA: Diagnosis not present

## 2017-07-27 DIAGNOSIS — Z79899 Other long term (current) drug therapy: Secondary | ICD-10-CM | POA: Diagnosis not present

## 2017-07-27 DIAGNOSIS — Z008 Encounter for other general examination: Secondary | ICD-10-CM | POA: Diagnosis not present

## 2017-07-27 DIAGNOSIS — R42 Dizziness and giddiness: Secondary | ICD-10-CM | POA: Diagnosis not present

## 2017-07-27 DIAGNOSIS — R2689 Other abnormalities of gait and mobility: Secondary | ICD-10-CM | POA: Insufficient documentation

## 2017-07-27 LAB — BASIC METABOLIC PANEL
Anion gap: 9 (ref 5–15)
BUN: 18 mg/dL (ref 6–20)
CO2: 24 mmol/L (ref 22–32)
CREATININE: 0.95 mg/dL (ref 0.44–1.00)
Calcium: 10.1 mg/dL (ref 8.9–10.3)
Chloride: 108 mmol/L (ref 101–111)
GFR calc Af Amer: >60 mL/min (ref >60)
GFR, EST NON AFRICAN AMERICAN: 55 mL/min — AB (ref >60)
GLUCOSE: 103 mg/dL — AB (ref 65–99)
POTASSIUM: 3.9 mmol/L (ref 3.5–5.1)
Sodium: 141 mmol/L (ref 135–145)

## 2017-07-27 LAB — CBC
HEMATOCRIT: 46.1 % — AB (ref 36.0–46.0)
Hemoglobin: 15.7 g/dL — ABNORMAL HIGH (ref 12.0–15.0)
MCH: 32.2 pg (ref 26.0–34.0)
MCHC: 34.1 g/dL (ref 30.0–36.0)
MCV: 94.5 fL (ref 78.0–100.0)
Platelets: 201 10*3/uL (ref 150–400)
RBC: 4.88 MIL/uL (ref 3.87–5.11)
RDW: 13.7 % (ref 11.5–15.5)
WBC: 7.1 10*3/uL (ref 4.0–10.5)

## 2017-07-27 NOTE — ED Triage Notes (Addendum)
Patient is from home and accompanied by niece. Patient reports she has been experiencing lightheadedness and difficulty with walking when she is lightheaded for the last 2 months. Patient reports she has been to [facility], PCP, and ENT provider for the same symptoms. Patient is ambulatory to triage with steady gait.

## 2017-07-28 ENCOUNTER — Emergency Department (HOSPITAL_COMMUNITY): Payer: Medicare Other

## 2017-07-28 ENCOUNTER — Emergency Department (HOSPITAL_COMMUNITY)
Admission: EM | Admit: 2017-07-28 | Discharge: 2017-07-28 | Disposition: A | Discharge status: Home / Self Care | Payer: Medicare Other | Attending: Emergency Medicine | Admitting: Emergency Medicine

## 2017-07-28 DIAGNOSIS — F419 Anxiety disorder, unspecified: Secondary | ICD-10-CM

## 2017-07-28 DIAGNOSIS — R42 Dizziness and giddiness: Secondary | ICD-10-CM | POA: Diagnosis not present

## 2017-07-28 LAB — URINALYSIS, ROUTINE W REFLEX MICROSCOPIC
Bacteria, UA: NONE SEEN
Bilirubin Urine: NEGATIVE
Glucose, UA: NEGATIVE mg/dL
Ketones, ur: 5 mg/dL — AB
Leukocytes, UA: NEGATIVE
Nitrite: NEGATIVE
PH: 6 (ref 5.0–8.0)
Protein, ur: NEGATIVE mg/dL
Specific Gravity, Urine: 1.006 (ref 1.005–1.030)

## 2017-07-28 NOTE — Discharge Instructions (Addendum)
Keep your appointment with Dr Phillips OdorGolding next week.  Consider getting an evaluation of your anxiety, look at the resource guide. If you need more ativan, you will need to get it from Dr Phillips OdorGolding.  Return to the ED if you get a headache, weakness in an arm or leg or you seem worse.

## 2017-07-28 NOTE — ED Provider Notes (Signed)
[facility]-EMERGENCY DEPT Provider Note   CSN: [id number] Arrival date & time: 07/27/17  2052  Time seen 04:40 AM   History   Chief Complaint Chief Complaint  Patient presents with  . Dizziness    HPI Ruth Ford is a 84 y.o. female.  HPI patient is here with her niece.  She states for the past 6 weeks she has been having episodes of getting lightheaded.  She has been to about 8 other doctors to be evaluated for this.  This includes her primary care, urgent care, ED.  This is her third ED visit here.  She states she gets the episodes about once a day sometimes twice.  However she did not have any episodes on March 25.  Last night she had an episode about 7:15 PM that lasted about 2 hours.  She states she was sitting down and she starts feeling lightheaded.  She denies having spinning sensation.  She states sometimes it feels like she is going to pass out.  She just states basically she "feels funny".  The main problem she has when she feels this way if she gets very anxious and she is afraid.  She cannot tell me what she is afraid of.  She denies headache, nausea, vomiting, but states maybe her vision is a little bit blurred in the distance.  She states she tries to sit still to make it feel better.  She states nothing she does helps except to take Ativan.  She denies palpitations, chest pain, shortness of breath, diaphoresis.  Her gait is normal.  They stopped all her medication except for lorazepam to make sure her medications were causing dizziness.  She was evaluated by an ENT about 2 weeks ago and he did not feel like she was having vertigo.  She states she cannot take the Antivert because it makes her "feel crazy" which means spaced out.  She reports a lot of stress the last 2 years.  She states she has had 2 hip surgeries, shingles, she had to move from [place] to [place], she is also very uncomfortable driving around [place].  Her niece lives just around the  corner and is able to help her out however.  She describes life long history of anxiety that has not been treated in the past.  She also relates her symptoms seeming to start after she had a URI about 8 weeks ago that was treated with a Z-Pak and prednisone.  She denies ever having any loss of consciousness with these episodes.  PCP Assunta Found, MD   Past Medical History:  Diagnosis Date  . Anxiety    occ  . Arthritis   . Complication of anesthesia    "goes to sleep fast"  . Distal radius fracture, left 07/08/2012  . Ear infection   . GERD (gastroesophageal reflux disease)   . Osteoarthritis of right hip 10/17/2013  . Primary localized osteoarthrosis, lower leg, left 07/08/2012  . Shortness of breath   . Vertigo     Patient Active Problem List   Diagnosis Date Noted  . Osteoarthritis of right hip 10/17/2013  . Hip arthritis 10/17/2013  . Osteoarthritis of left hip 07/08/2012  . Distal radius fracture, left 07/08/2012    Past Surgical History:  Procedure Laterality Date  . BACK SURGERY    . EYE SURGERY Bilateral    catract  . JOINT REPLACEMENT Left 07/07/12  . OPEN REDUCTION INTERNAL FIXATION (ORIF) DISTAL RADIAL FRACTURE Left 07/07/2012   Procedure:  OPEN REDUCTION INTERNAL FIXATION (ORIF) DISTAL RADIAL FRACTURE;  Surgeon: Eulas Post, MD;  Location: [facility] OR;  Service: Orthopedics;  Laterality: Left;  . TOTAL HIP ARTHROPLASTY Left 07/07/2012   Procedure: TOTAL HIP ARTHROPLASTY;  Surgeon: Eulas Post, MD;  Location: [facility] OR;  Service: Orthopedics;  Laterality: Left;  . TOTAL HIP ARTHROPLASTY Right 10/17/2013   Procedure: TOTAL HIP ARTHROPLASTY;  Surgeon: Eulas Post, MD;  Location: [facility] OR;  Service: Orthopedics;  Laterality: Right;     OB History   None      Home Medications    Prior to Admission medications   Medication Sig Start Date End Date Taking? Authorizing Provider  dorzolamide-timolol (COSOPT) 22.3-6.8 MG/ML ophthalmic solution Place 1 drop into both eyes 2  (two) times daily. 07/04/17  Yes [provider]  LORazepam (ATIVAN) 0.5 MG tablet Take 0.5 tablets by mouth 3 (three) times daily.  06/30/17  Yes [provider]  hydrochlorothiazide (HYDRODIURIL) 25 MG tablet Take 1 tablet (25 mg total) by mouth daily. Patient not taking: Reported on 07/28/2017 04/06/16   Dione Booze, MD    Family History History reviewed. No pertinent family history.  Social History Social History   Tobacco Use  . Smoking status: Never Smoker  . Smokeless tobacco: Never Used  Substance Use Topics  . Alcohol use: No  . Drug use: No  lives at home Lives alone   Allergies   Meclizine   Review of Systems Review of Systems  All other systems reviewed and are negative.    Physical Exam Updated Vital Signs BP 134/81   Pulse 79   Temp 98.9 F (37.2 C) (Oral)   Resp (!) 21   Ht 5\' 5"  (1.651 m)   Wt 82.7 kg (182 lb 4.8 oz)   SpO2 96%   BMI 30.34 kg/m   Vital signs normal    Physical Exam  Constitutional: She is oriented to person, place, and time. She appears well-developed and well-nourished.  Non-toxic appearance. She does not appear ill. No distress.  HENT:  Head: Normocephalic and atraumatic.  Right Ear: External ear normal.  Left Ear: External ear normal.  Nose: Nose normal. No mucosal edema or rhinorrhea.  Mouth/Throat: Oropharynx is clear and moist and mucous membranes are normal. No dental abscesses or uvula swelling.  Eyes: Pupils are equal, round, and reactive to light. Conjunctivae and EOM are normal.  Neck: Normal range of motion and full passive range of motion without pain. Neck supple.  Cardiovascular: Normal rate, regular rhythm and normal heart sounds. Exam reveals no gallop and no friction rub.  No murmur heard. Pulmonary/Chest: Effort normal and breath sounds normal. No respiratory distress. She has no wheezes. She has no rhonchi. She has no rales. She exhibits no tenderness and no crepitus.  Abdominal: Soft.  Normal appearance and bowel sounds are normal. She exhibits no distension. There is no tenderness. There is no rebound and no guarding.  Musculoskeletal: Normal range of motion. She exhibits no edema or tenderness.  Moves all extremities well.   Neurological: She is alert and oriented to person, place, and time. She has normal strength. No cranial nerve deficit.  Skin: Skin is warm, dry and intact. No rash noted. No erythema. No pallor.  Psychiatric: She has a normal mood and affect. Her speech is normal and behavior is normal. Her mood appears not anxious.  Nursing note and vitals reviewed.    ED Treatments / Results  Labs (all labs ordered are listed, but  only abnormal results are displayed) Results for orders placed or performed during the hospital encounter of 07/28/17  Basic metabolic panel  Result Value Ref Range   Sodium 141 135 - 145 mmol/L   Potassium 3.9 3.5 - 5.1 mmol/L   Chloride 108 101 - 111 mmol/L   CO2 24 22 - 32 mmol/L   Glucose, Bld 103 (H) 65 - 99 mg/dL   BUN 18 6 - 20 mg/dL   Creatinine, Ser 4.090.95 0.44 - 1.00 mg/dL   Calcium 81.110.1 8.9 - 91.410.3 mg/dL   GFR calc non Af Amer 55 (L) >60 mL/min   GFR calc Af Amer >60 >60 mL/min   Anion gap 9 5 - 15  CBC  Result Value Ref Range   WBC 7.1 4.0 - 10.5 K/uL   RBC 4.88 3.87 - 5.11 MIL/uL   Hemoglobin 15.7 (H) 12.0 - 15.0 g/dL   HCT 78.246.1 (H) 95.636.0 - 21.346.0 %   MCV 94.5 78.0 - 100.0 fL   MCH 32.2 26.0 - 34.0 pg   MCHC 34.1 30.0 - 36.0 g/dL   RDW 08.613.7 57.811.5 - 46.915.5 %   Platelets 201 150 - 400 K/uL  Urinalysis, Routine w reflex microscopic  Result Value Ref Range   Color, Urine YELLOW YELLOW   APPearance CLEAR CLEAR   Specific Gravity, Urine 1.006 1.005 - 1.030   pH 6.0 5.0 - 8.0   Glucose, UA NEGATIVE NEGATIVE mg/dL   Hgb urine dipstick SMALL (A) NEGATIVE   Bilirubin Urine NEGATIVE NEGATIVE   Ketones, ur 5 (A) NEGATIVE mg/dL   Protein, ur NEGATIVE NEGATIVE mg/dL   Nitrite NEGATIVE NEGATIVE   Leukocytes, UA NEGATIVE  NEGATIVE   RBC / HPF 0-5 0 - 5 RBC/hpf   WBC, UA 0-5 0 - 5 WBC/hpf   Bacteria, UA NONE SEEN NONE SEEN   Squamous Epithelial / LPF 0-5 (A) NONE SEEN   Mucus PRESENT    Laboratory interpretation all normal     EKG EKG Interpretation  Date/Time:  Tuesday July 27 2017 22:01:00 EDT Ventricular Rate:  91 PR Interval:    QRS Duration: 101 QT Interval:  374 QTC Calculation: 461 R Axis:   -36 Text Interpretation:  Sinus rhythm Abnormal R-wave progression, early transition Left ventricular hypertrophy Baseline wander in lead(s) V6 No significant change since last tracing 12 Jul 2017 Confirmed by Devoria AlbeKnapp, Ethelle Ola ([id number]) on 07/28/2017 6:42:23 AM   Radiology Ct Head Wo Contrast  Result Date: 07/28/2017 CLINICAL DATA:  [age] y/o F; lightheadedness and imbalance for 6 weeks. EXAM: CT HEAD WITHOUT CONTRAST TECHNIQUE: Contiguous axial images were obtained from the base of the skull through the vertex without intravenous contrast. COMPARISON:  None. FINDINGS: Brain: No evidence of acute infarction, hemorrhage, hydrocephalus, extra-axial collection or mass lesion/mass effect. Fewnonspecific foci of hypoattenuation in subcortical and periventricular white matter are compatible withmildchronic microvascular ischemic changes for age. Mildbrain parenchymal volume loss. Vascular: Calcific atherosclerosis of carotid siphons. No hyperdense vessel identified. Skull: Normal. Negative for fracture or focal lesion. Sinuses/Orbits: Mild right maxillary sinus mucosal thickening and left maxillary sinus mucous retention cyst. Bilateral intra-ocular lens replacement. Other: None. IMPRESSION: 1. No acute intracranial abnormality. 2. Mild chronic microvascular ischemic changes and parenchymal volume loss of the brain. 3. Mild maxillary sinus disease. Electronically Signed   By: Mitzi HansenLance  Furusawa-Stratton M.D.   On: 07/28/2017 06:25    Procedures Procedures (including critical care time)  Medications Ordered in ED Medications -  No data to display   Initial Impression /  Assessment and Plan / ED Course  I have reviewed the triage vital signs and the nursing notes.  Pertinent labs & imaging results that were available during my care of the patient were reviewed by me and considered in my medical decision making (see chart for details).     Patient has not had a head CT done during the evaluation of the symptoms in the past 2 months.  Head CT was ordered.  When I review her visits that are visible in care everywhere Dr. Jenne Pane, ENT saw her on March 14 and did not think her dizziness had a ENT source.  He thought it might be cardiac.  She also was seen at the urgent care on February 23 and at that time both with Dr. Jenne Pane and that visit patient stated the meclizine helped her dizziness.  Pt states her dizziness used to be really bad, now just feels light headed at times and gets anxious and afraid.  She is interested in getting a mental health evaluation for her anxiety and would like a second opinion from ENT.    Final Clinical Impressions(s) / ED Diagnoses   Final diagnoses:  Lightheadedness  Anxiety    ED Discharge Orders    None      Plan discharge  Devoria Albe, MD, Concha Pyo, MD 07/28/17 [phone]

## 2017-07-28 NOTE — ED Notes (Signed)
Upon speaking further with patient, patient is reluctant to speak and answer questions. Per patient's niece, this is the third visit here to the ER for this complaint. Patient has been seen by her PCP, ENT, and [facility] and urgent care for same. Patient states she is having episodes on and off for the past 6 weeks of vertigo-like symptoms. Patient states that she feels off balance, but not like the room is spinning. Patient states her Ativan is the only thing that helps.

## 2017-08-03 DIAGNOSIS — Z1389 Encounter for screening for other disorder: Secondary | ICD-10-CM | POA: Diagnosis not present

## 2017-08-03 DIAGNOSIS — E782 Mixed hyperlipidemia: Secondary | ICD-10-CM | POA: Diagnosis not present

## 2017-08-03 DIAGNOSIS — Z6830 Body mass index (BMI) 30.0-30.9, adult: Secondary | ICD-10-CM | POA: Diagnosis not present

## 2017-08-03 DIAGNOSIS — F419 Anxiety disorder, unspecified: Secondary | ICD-10-CM | POA: Diagnosis not present

## 2017-08-03 DIAGNOSIS — R7309 Other abnormal glucose: Secondary | ICD-10-CM | POA: Diagnosis not present

## 2017-08-13 ENCOUNTER — Emergency Department (HOSPITAL_COMMUNITY): Payer: Medicare Other

## 2017-08-13 ENCOUNTER — Other Ambulatory Visit: Payer: Self-pay

## 2017-08-13 ENCOUNTER — Emergency Department (HOSPITAL_COMMUNITY)
Admission: EM | Admit: 2017-08-13 | Discharge: 2017-08-13 | Disposition: A | Discharge status: Home / Self Care | Payer: Medicare Other | Attending: Emergency Medicine | Admitting: Emergency Medicine

## 2017-08-13 ENCOUNTER — Encounter (HOSPITAL_COMMUNITY): Payer: Self-pay

## 2017-08-13 DIAGNOSIS — R0600 Dyspnea, unspecified: Secondary | ICD-10-CM | POA: Insufficient documentation

## 2017-08-13 DIAGNOSIS — R0602 Shortness of breath: Secondary | ICD-10-CM | POA: Diagnosis not present

## 2017-08-13 DIAGNOSIS — Z79899 Other long term (current) drug therapy: Secondary | ICD-10-CM | POA: Diagnosis not present

## 2017-08-13 DIAGNOSIS — F411 Generalized anxiety disorder: Secondary | ICD-10-CM | POA: Diagnosis not present

## 2017-08-13 DIAGNOSIS — R062 Wheezing: Secondary | ICD-10-CM | POA: Diagnosis not present

## 2017-08-13 DIAGNOSIS — F410 Panic disorder [episodic paroxysmal anxiety] without agoraphobia: Secondary | ICD-10-CM | POA: Diagnosis not present

## 2017-08-13 DIAGNOSIS — F419 Anxiety disorder, unspecified: Secondary | ICD-10-CM | POA: Diagnosis not present

## 2017-08-13 DIAGNOSIS — I700 Atherosclerosis of aorta: Secondary | ICD-10-CM | POA: Diagnosis not present

## 2017-08-13 LAB — CBC WITH DIFFERENTIAL/PLATELET
BASOS ABS: 0.1 10*3/uL (ref 0.0–0.1)
BASOS PCT: 1 %
Eosinophils Absolute: 0.3 10*3/uL (ref 0.0–0.7)
Eosinophils Relative: 4 %
HEMATOCRIT: 48.1 % — AB (ref 36.0–46.0)
HEMOGLOBIN: 15.6 g/dL — AB (ref 12.0–15.0)
LYMPHS PCT: 19 %
Lymphs Abs: 1.4 10*3/uL (ref 0.7–4.0)
MCH: 31 pg (ref 26.0–34.0)
MCHC: 32.4 g/dL (ref 30.0–36.0)
MCV: 95.6 fL (ref 78.0–100.0)
MONO ABS: 0.5 10*3/uL (ref 0.1–1.0)
MONOS PCT: 7 %
NEUTROS ABS: 4.9 10*3/uL (ref 1.7–7.7)
NEUTROS PCT: 69 %
Platelets: 217 10*3/uL (ref 150–400)
RBC: 5.03 MIL/uL (ref 3.87–5.11)
RDW: 13.8 % (ref 11.5–15.5)
WBC: 7.1 10*3/uL (ref 4.0–10.5)

## 2017-08-13 LAB — COMPREHENSIVE METABOLIC PANEL
ALBUMIN: 3.9 g/dL (ref 3.5–5.0)
ALT: 13 U/L — ABNORMAL LOW (ref 14–54)
ANION GAP: 9 (ref 5–15)
AST: 18 U/L (ref 15–41)
Alkaline Phosphatase: 55 U/L (ref 38–126)
BUN: 15 mg/dL (ref 6–20)
CALCIUM: 10 mg/dL (ref 8.9–10.3)
CO2: 25 mmol/L (ref 22–32)
Chloride: 109 mmol/L (ref 101–111)
Creatinine, Ser: 1.08 mg/dL — ABNORMAL HIGH (ref 0.44–1.00)
GFR calc non Af Amer: 47 mL/min — ABNORMAL LOW (ref >60)
GFR, EST AFRICAN AMERICAN: 54 mL/min — AB (ref >60)
GLUCOSE: 108 mg/dL — AB (ref 65–99)
POTASSIUM: 4.2 mmol/L (ref 3.5–5.1)
SODIUM: 143 mmol/L (ref 135–145)
Total Bilirubin: 0.7 mg/dL (ref 0.3–1.2)
Total Protein: 7.1 g/dL (ref 6.5–8.1)

## 2017-08-13 LAB — I-STAT TROPONIN, ED
TROPONIN I, POC: 0.01 ng/mL (ref 0.00–0.08)
Troponin i, poc: 0 ng/mL (ref 0.00–0.08)

## 2017-08-13 LAB — D-DIMER, QUANTITATIVE: D-Dimer, Quant: 4.46 ug/mL-FEU — ABNORMAL HIGH (ref 0.00–0.50)

## 2017-08-13 MED ORDER — IOPAMIDOL (ISOVUE-370) INJECTION 76%
INTRAVENOUS | Status: AC
  Administered 2017-08-13: 100 mL via INTRAVENOUS
  Filled 2017-08-13: qty 100

## 2017-08-13 MED ORDER — IOPAMIDOL (ISOVUE-370) INJECTION 76%
100.0000 mL | Freq: Once | INTRAVENOUS | Status: AC | PRN
  Administered 2017-08-13: 100 mL via INTRAVENOUS

## 2017-08-13 MED ORDER — SODIUM CHLORIDE 0.9 % IJ SOLN
INTRAMUSCULAR | Status: AC
  Filled 2017-08-13: qty 50

## 2017-08-13 NOTE — ED Notes (Signed)
Bed: WA08 Expected date:  Expected time:  Means of arrival:  Comments: SOB/anxiety

## 2017-08-13 NOTE — ED Triage Notes (Signed)
Per [facility]. Pt resides from home. Sudden on set of anxiety without event today. HX of anxiety with frequent episodes. Slight wheezing Rt side. Albuterol 0.5mg  given in route with improvement. RX ativan taken by pt at 0845. Pt denies CP, dizziness N/V/D or fever by this pt.

## 2017-08-13 NOTE — ED Notes (Signed)
Patient transported to CT 

## 2017-08-13 NOTE — ED Provider Notes (Signed)
[facility]-EMERGENCY DEPT Provider Note   CSN: [id number] Arrival date & time: 08/13/17  1027     History   Chief Complaint Chief Complaint  Patient presents with  . Anxiety  . Shortness of Breath    HPI Ruth Ford is a 84 y.o. female.  HPI  84 year old female presents with anxiety and shortness of breath.  She states that she was eating breakfast and around 9 AM she all of a sudden started feeling funny.  She describes it as a scared feeling.  She was also short of breath.  This is happened to her multiple times over the last several months at least.  Today the shortness of breath seemed more severe so she called 911.  She also took her dose of Ativan and another medicine she cannot member the name of.  Upon arrival here with EMS she now feels significantly better.  She states she feels slightly tremulous but the scared feeling and dyspnea have resolved.  There was never any chest pain.  No headaches, weakness or numbness.  She has not been feeling sick otherwise.  She states she has been evaluated for these type symptoms multiple times before.  She is not sure if the Ativan kicked in or the oxygen that she was given by EMS made her feel better.  She has minimal chronic right lower extremity swelling but not worse than typical.  Otherwise no new or concerning leg pain or swelling. No cough.  Past Medical History:  Diagnosis Date  . Anxiety    occ  . Arthritis   . Complication of anesthesia    "goes to sleep fast"  . Distal radius fracture, left 07/08/2012  . Ear infection   . GERD (gastroesophageal reflux disease)   . Osteoarthritis of right hip 10/17/2013  . Primary localized osteoarthrosis, lower leg, left 07/08/2012  . Shortness of breath   . Vertigo     Patient Active Problem List   Diagnosis Date Noted  . Osteoarthritis of right hip 10/17/2013  . Hip arthritis 10/17/2013  . Osteoarthritis of left hip 07/08/2012  . Distal radius fracture, left  07/08/2012    Past Surgical History:  Procedure Laterality Date  . BACK SURGERY    . EYE SURGERY Bilateral    catract  . JOINT REPLACEMENT Left 07/07/12  . OPEN REDUCTION INTERNAL FIXATION (ORIF) DISTAL RADIAL FRACTURE Left 07/07/2012   Procedure: OPEN REDUCTION INTERNAL FIXATION (ORIF) DISTAL RADIAL FRACTURE;  Surgeon: Eulas Post, MD;  Location: [facility] OR;  Service: Orthopedics;  Laterality: Left;  . TOTAL HIP ARTHROPLASTY Left 07/07/2012   Procedure: TOTAL HIP ARTHROPLASTY;  Surgeon: Eulas Post, MD;  Location: [facility] OR;  Service: Orthopedics;  Laterality: Left;  . TOTAL HIP ARTHROPLASTY Right 10/17/2013   Procedure: TOTAL HIP ARTHROPLASTY;  Surgeon: Eulas Post, MD;  Location: [facility] OR;  Service: Orthopedics;  Laterality: Right;     OB History   None      Home Medications    Prior to Admission medications   Medication Sig Start Date End Date Taking? Authorizing Provider  dorzolamide-timolol (COSOPT) 22.3-6.8 MG/ML ophthalmic solution Place 1 drop into both eyes 2 (two) times daily. 07/04/17  Yes [provider]  escitalopram (LEXAPRO) 10 MG tablet Take 10 mg by mouth daily. 08/03/17  Yes [provider]  LORazepam (ATIVAN) 0.5 MG tablet Take 0.25 mg by mouth 3 (three) times daily.  06/30/17  Yes [provider]  pravastatin (PRAVACHOL) 20 MG tablet Take  20 mg by mouth daily. 08/11/17  Yes [provider]  hydrochlorothiazide (HYDRODIURIL) 25 MG tablet Take 1 tablet (25 mg total) by mouth daily. Patient not taking: Reported on 07/28/2017 04/06/16   Dione Booze, MD    Family History No family history on file.  Social History Social History   Tobacco Use  . Smoking status: Never Smoker  . Smokeless tobacco: Never Used  Substance Use Topics  . Alcohol use: No  . Drug use: No     Allergies   Meclizine   Review of Systems Review of Systems  Respiratory: Positive for shortness of breath.   Cardiovascular: Negative for chest pain.    Gastrointestinal: Negative for vomiting.  Neurological: Negative for dizziness, weakness, numbness and headaches.  Psychiatric/Behavioral: The patient is nervous/anxious.   All other systems reviewed and are negative.    Physical Exam Updated Vital Signs BP (!) 152/89   Pulse (!) 58   Temp 98.1 F (36.7 C) (Oral)   Resp 14   Ht 5\' 5"  (1.651 m)   Wt 83.9 kg (185 lb)   SpO2 97%   BMI 30.79 kg/m   Physical Exam  Constitutional: She is oriented to person, place, and time. She appears well-developed and well-nourished.  Non-toxic appearance. She does not appear ill. No distress.  HENT:  Head: Normocephalic and atraumatic.  Right Ear: External ear normal.  Left Ear: External ear normal.  Nose: Nose normal.  Eyes: Right eye exhibits no discharge. Left eye exhibits no discharge.  Cardiovascular: Normal rate, regular rhythm and normal heart sounds.  Pulmonary/Chest: Effort normal and breath sounds normal.  Abdominal: Soft. There is no tenderness.  Musculoskeletal:       Right lower leg: She exhibits no edema.       Left lower leg: She exhibits no edema.  Neurological: She is alert and oriented to person, place, and time.  Skin: Skin is warm and dry.  Nursing note and vitals reviewed.    ED Treatments / Results  Labs (all labs ordered are listed, but only abnormal results are displayed) Labs Reviewed  COMPREHENSIVE METABOLIC PANEL - Abnormal; Notable for the following components:      Result Value   Glucose, Bld 108 (*)    Creatinine, Ser 1.08 (*)    ALT 13 (*)    GFR calc non Af Amer 47 (*)    GFR calc Af Amer 54 (*)    All other components within normal limits  CBC WITH DIFFERENTIAL/PLATELET - Abnormal; Notable for the following components:   Hemoglobin 15.6 (*)    HCT 48.1 (*)    All other components within normal limits  D-DIMER, QUANTITATIVE (NOT AT [facility]) - Abnormal; Notable for the following components:   D-Dimer, Quant 4.46 (*)    All other components within  normal limits  I-STAT TROPONIN, ED  I-STAT TROPONIN, ED    EKG EKG Interpretation  Date/Time:  Friday August 13 2017 11:20:25 EDT Ventricular Rate:  64 PR Interval:    QRS Duration: 96 QT Interval:  438 QTC Calculation: 452 R Axis:   -19 Text Interpretation:  Normal sinus rhythm Abnormal R-wave progression, early transition Probable left ventricular hypertrophy nonspecific T wave flattening no significant change compared to mar 2019 Confirmed by Pricilla Loveless [phone]) on 08/13/2017 11:24:57 AM Also confirmed by Pricilla Loveless [phone]), editor Nevis, Tamera Punt ([id number])  on 08/13/2017 3:18:49 PM  EKG Interpretation  Date/Time:  Friday August 13 2017 15:18:59 EDT Ventricular Rate:  67 PR Interval:  QRS Duration: 91 QT Interval:  460 QTC Calculation: 486 R Axis:   -6 Text Interpretation:  Sinus rhythm Abnormal R-wave progression, early transition LVH by voltage Borderline prolonged QT interval no significant change compared to earlier in the day Confirmed by Pricilla LovelessGoldston, Shirla Hodgkiss [phone]([id number]) on 08/13/2017 3:25:12 PM   Radiology Dg Chest 2 View  Result Date: 08/13/2017 CLINICAL DATA:  Sudden onset of anxiety.  Wheezing. EXAM: CHEST - 2 VIEW COMPARISON:  CT 12/07/2013. FINDINGS: Mediastinum hilar structures normal. Lungs are clear. No pleural effusion or pneumothorax. Cardiomegaly with normal pulmonary vascularity. Thoracic spine scoliosis. Thoracic spine degenerative change and osteopenia. IMPRESSION: Cardiomegaly. No pulmonary venous congestion. No acute pulmonary disease. Electronically Signed   By: Maisie Fushomas  Register   On: 08/13/2017 11:47   Ct Angio Chest Pe W/cm &/or Wo Cm  Result Date: 08/13/2017 CLINICAL DATA:  Pt resides from home. Sudden on set of anxiety without event today. HX of anxiety with frequent episodes. Slight wheezing Rt side. Albuterol 0.5mg  given in route with improvement. RX ativan taken by pt at 0845. Pt denies CP, dizziness N/V/D or fever by this pt. EXAM: CT ANGIOGRAPHY  CHEST WITH CONTRAST TECHNIQUE: Multidetector CT imaging of the chest was performed using the standard protocol during bolus administration of intravenous contrast. Multiplanar CT image reconstructions and MIPs were obtained to evaluate the vascular anatomy. CONTRAST:  100mL ISOVUE-370 IOPAMIDOL (ISOVUE-370) INJECTION 76% COMPARISON:  Current chest radiograph.  Chest CT, 12/07/2013. FINDINGS: Cardiovascular: Satisfactory opacification of the pulmonary arteries to the segmental level. No evidence of pulmonary embolism. Heart top-normal in size. No significant pericardial effusion. No coronary artery calcifications. Great vessels normal in caliber. Mild aortic atherosclerotic disease. No dissection. Mediastinum/Nodes: No neck base, axillary, mediastinal or hilar masses or enlarged lymph nodes. Normal appearance of the thyroid. Trachea is widely patent. Esophagus is unremarkable. Small hiatal hernia. Lungs/Pleura: 7 mm pleural-based right lower lobe nodule, image 90, series 10, stable from the prior CT. No new or suspicious nodules. No lung masses. Minor dependent subsegmental atelectasis. No evidence of pneumonia or pulmonary edema. No pleural effusion or pneumothorax. Upper Abdomen: No acute abnormality. Musculoskeletal: No fracture or acute finding. No osteoblastic or osteolytic lesions. Bones are demineralized. Review of the MIP images confirms the above findings. IMPRESSION: 1. No evidence of a pulmonary embolism. 2. No acute findings. 3. 7 mm benign right lower lung nodule. Aortic Atherosclerosis (ICD10-I70.0). Electronically Signed   By: Amie Portlandavid  Ormond M.D.   On: 08/13/2017 14:01    Procedures Procedures (including critical care time)  Medications Ordered in ED Medications  sodium chloride 0.9 % injection (has no administration in time range)  iopamidol (ISOVUE-370) 76 % injection 100 mL (100 mLs Intravenous Contrast Given 08/13/17 1336)     Initial Impression / Assessment and Plan / ED Course  I have  reviewed the triage vital signs and the nursing notes.  Pertinent labs & imaging results that were available during my care of the patient were reviewed by me and considered in my medical decision making (see chart for details).     I think the patient's shortness of breath was likely anxiety related.  However given age, further workup was obtained which shows 2 negative troponins, unchanging and nonischemic EKG and negative workup for PE.  All of her symptoms seem to resolve after taking her lorazepam.  She has been having on and off anxiety for several months.  She is being started on Lexapro for depression as well but she denies suicidal thoughts. I have discussed  the importance of following up with a therapist and/or psychiatrist but she can also follow-up with her PCP.  However she appears stable for discharge home at this time with return precautions.  I doubt ACS.  Final Clinical Impressions(s) / ED Diagnoses   Final diagnoses:  Anxiety attack  Dyspnea, unspecified type    ED Discharge Orders    None       Pricilla Loveless, MD 08/13/17 1530

## 2017-08-13 NOTE — Discharge Instructions (Signed)
Outpatient Psychiatry and Counseling ° °[facility]: Mobile Crisis Management 24 hours:  [phone] ° °[facility] sliding scale fee and walk in schedule: M-F 8am-12pm/1pm-3pm °[address]  °[place], [place] [address] °[phone] ° °[facility] °[address] °[place], [place] [address] °[phone] ° °[facility] ([facility] known [facility])- new patient walk-in appointments available Monday - Friday 8am -3pm.          °[address] °[place], [place] [address] °[phone] or crisis line- [phone] ° °[facility]/ Intensive Outpatient Therapy Program °[address] °[place], [place] [address] °[phone] ° °[facility]                  °Crisis Services      °[phone]      °[address]     °[place], [place] [address]                ° °[facility]   °[facility] [phone] °[address] °[place], [place] [address] ° ° °[facility]          °[address]  °[place], [place] [address]       °[phone] ° °[facility] °[address] °[place], [place] [address] °[phone] ° °[facility]    °[address]    °[place], [place] [address]     °[phone]      ° °Parish McKinney, MD     °[address]     °[place] [address]     °[phone]     °  °[facility] °[address] °[place] [address] ° °[facility]     °[address]     °[place], [place]      °[phone]      ° °[facility] °Shamsher Ahluwalia, MD °[address] °[place], [place] [address] °[phone] ° °[facility]     °[address]     °[place], [place] [address]     °[phone]      ° °[facility] °[address] °[place], [place] [address] °[phone] °[facility]/Crisis Centers ° °

## 2017-08-13 NOTE — ED Notes (Signed)
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 NIECE HAS CAUSED A LOT OF STRESS TO PATIENT. EDP [place]  MADE AWARE

## 2017-08-13 NOTE — ED Notes (Signed)
Patient transported to X-ray 

## 2017-08-13 NOTE — ED Notes (Signed)
ED Provider at bedside. 

## 2017-08-30 DIAGNOSIS — F419 Anxiety disorder, unspecified: Secondary | ICD-10-CM | POA: Diagnosis not present

## 2017-08-30 DIAGNOSIS — F410 Panic disorder [episodic paroxysmal anxiety] without agoraphobia: Secondary | ICD-10-CM | POA: Diagnosis not present

## 2017-08-30 DIAGNOSIS — Z6830 Body mass index (BMI) 30.0-30.9, adult: Secondary | ICD-10-CM | POA: Diagnosis not present

## 2017-08-30 DIAGNOSIS — E6609 Other obesity due to excess calories: Secondary | ICD-10-CM | POA: Diagnosis not present

## 2017-10-27 DIAGNOSIS — Z1389 Encounter for screening for other disorder: Secondary | ICD-10-CM | POA: Diagnosis not present

## 2017-10-27 DIAGNOSIS — Z0001 Encounter for general adult medical examination with abnormal findings: Secondary | ICD-10-CM | POA: Diagnosis not present

## 2017-10-27 DIAGNOSIS — Z6830 Body mass index (BMI) 30.0-30.9, adult: Secondary | ICD-10-CM | POA: Diagnosis not present

## 2017-10-27 DIAGNOSIS — E6609 Other obesity due to excess calories: Secondary | ICD-10-CM | POA: Diagnosis not present

## 2017-10-27 DIAGNOSIS — F419 Anxiety disorder, unspecified: Secondary | ICD-10-CM | POA: Diagnosis not present

## 2017-12-09 DIAGNOSIS — H40013 Open angle with borderline findings, low risk, bilateral: Secondary | ICD-10-CM | POA: Diagnosis not present

## 2017-12-28 DIAGNOSIS — M7731 Calcaneal spur, right foot: Secondary | ICD-10-CM | POA: Diagnosis not present

## 2017-12-28 DIAGNOSIS — M722 Plantar fascial fibromatosis: Secondary | ICD-10-CM | POA: Diagnosis not present

## 2018-01-04 DIAGNOSIS — M722 Plantar fascial fibromatosis: Secondary | ICD-10-CM | POA: Diagnosis not present

## 2018-01-11 DIAGNOSIS — M71571 Other bursitis, not elsewhere classified, right ankle and foot: Secondary | ICD-10-CM | POA: Diagnosis not present

## 2018-01-11 DIAGNOSIS — M722 Plantar fascial fibromatosis: Secondary | ICD-10-CM | POA: Diagnosis not present

## 2018-03-07 DIAGNOSIS — Z6830 Body mass index (BMI) 30.0-30.9, adult: Secondary | ICD-10-CM | POA: Diagnosis not present

## 2018-03-07 DIAGNOSIS — F419 Anxiety disorder, unspecified: Secondary | ICD-10-CM | POA: Diagnosis not present

## 2018-03-07 DIAGNOSIS — Z23 Encounter for immunization: Secondary | ICD-10-CM | POA: Diagnosis not present

## 2018-03-07 DIAGNOSIS — Z1389 Encounter for screening for other disorder: Secondary | ICD-10-CM | POA: Diagnosis not present

## 2018-03-07 DIAGNOSIS — M169 Osteoarthritis of hip, unspecified: Secondary | ICD-10-CM | POA: Diagnosis not present

## 2018-03-07 DIAGNOSIS — E663 Overweight: Secondary | ICD-10-CM | POA: Diagnosis not present

## 2018-03-07 DIAGNOSIS — R7309 Other abnormal glucose: Secondary | ICD-10-CM | POA: Diagnosis not present

## 2018-03-07 DIAGNOSIS — E782 Mixed hyperlipidemia: Secondary | ICD-10-CM | POA: Diagnosis not present

## 2018-03-08 DIAGNOSIS — H40053 Ocular hypertension, bilateral: Secondary | ICD-10-CM | POA: Diagnosis not present

## 2018-07-12 DIAGNOSIS — H40053 Ocular hypertension, bilateral: Secondary | ICD-10-CM | POA: Diagnosis not present

## 2018-07-28 DIAGNOSIS — F419 Anxiety disorder, unspecified: Secondary | ICD-10-CM | POA: Diagnosis not present

## 2018-10-31 DIAGNOSIS — Z1389 Encounter for screening for other disorder: Secondary | ICD-10-CM | POA: Diagnosis not present

## 2018-10-31 DIAGNOSIS — Z0001 Encounter for general adult medical examination with abnormal findings: Secondary | ICD-10-CM | POA: Diagnosis not present

## 2018-10-31 DIAGNOSIS — Z681 Body mass index (BMI) 19 or less, adult: Secondary | ICD-10-CM | POA: Diagnosis not present

## 2018-12-13 DIAGNOSIS — H40023 Open angle with borderline findings, high risk, bilateral: Secondary | ICD-10-CM | POA: Diagnosis not present

## 2019-01-04 DIAGNOSIS — Z23 Encounter for immunization: Secondary | ICD-10-CM | POA: Diagnosis not present

## 2019-01-08 IMAGING — CT CT ANGIO CHEST
2 of 7 series · 18 of 46 positions shown · IV contrast (ISOVUE 370)
Comparison: Current chest radiograph.  Chest CT, 12/07/2013.

CLINICAL DATA: Pt resides from home. Sudden on set of anxiety
without event today. HX of anxiety with frequent episodes. Slight
wheezing Rt side. Albuterol 0.5mg given in route with improvement.
NATHALIE ativan taken by pt at 9214. Pt denies CP, dizziness N/V/D or
fever by this pt.

EXAM:
CT ANGIOGRAPHY CHEST WITH CONTRAST
TECHNIQUE: Multidetector CT imaging of the chest was performed using the
standard protocol during bolus administration of intravenous
contrast. Multiplanar CT image reconstructions and MIPs were
obtained to evaluate the vascular anatomy.
CONTRAST:  100mL TNAW0A-TFC IOPAMIDOL (TNAW0A-TFC) INJECTION 76%

[Series 5: thins · axial · 0.65mm/px · z∈[-257,-6]mm · 15 of 285 slices shown]
[im 17/285  lung]
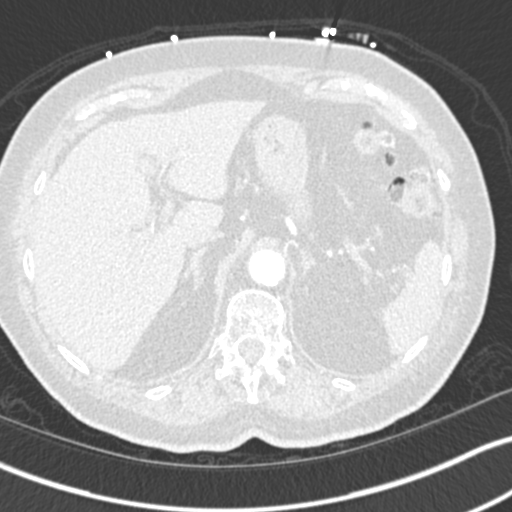
[im 34/285  soft-tissue]
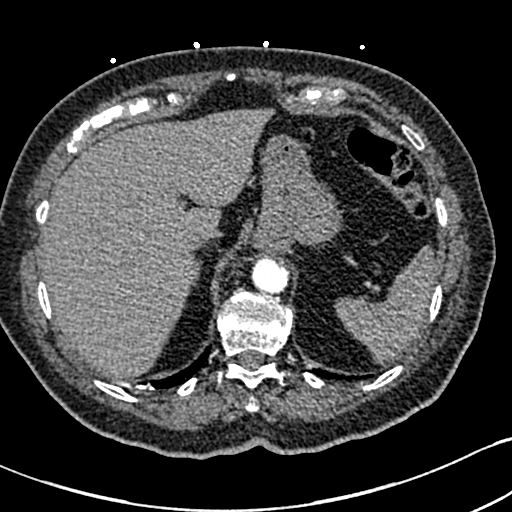
[im 51/285  lung]
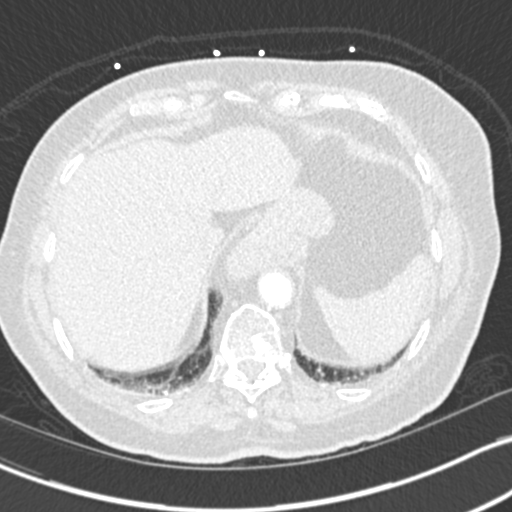
[im 67/285  soft-tissue]
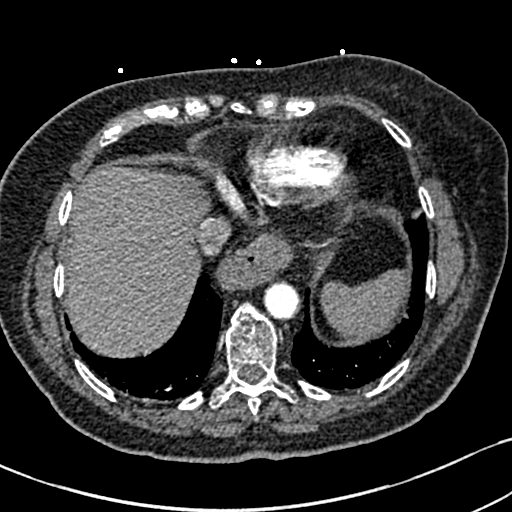
[im 84/285  lung]
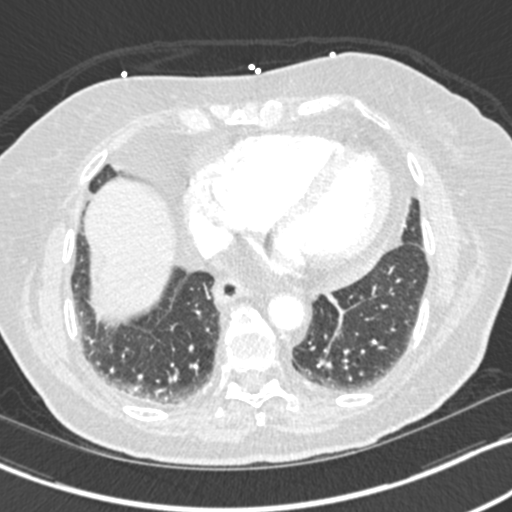
[im 101/285  soft-tissue]
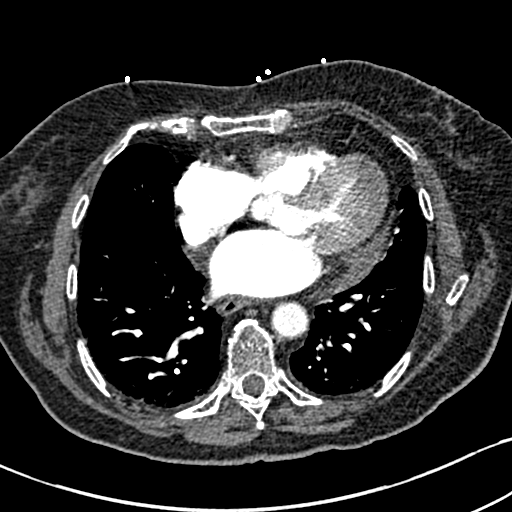
[im 117/285  lung]
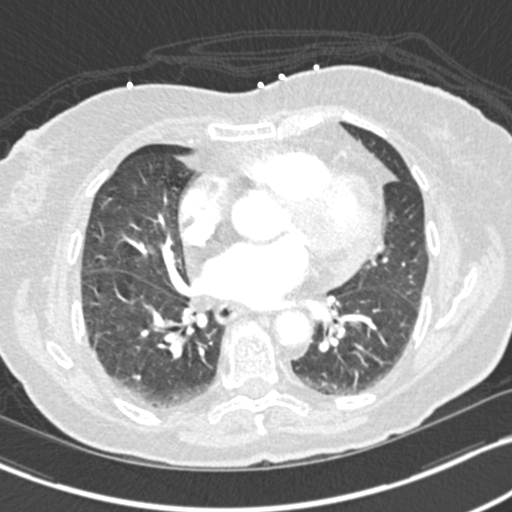
[im 151/285  soft-tissue]
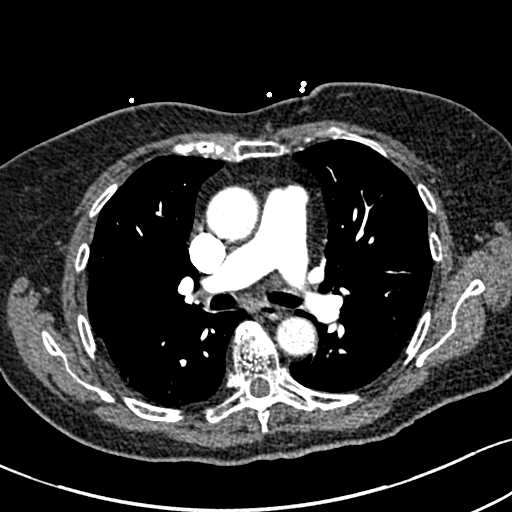
[im 168/285  lung]
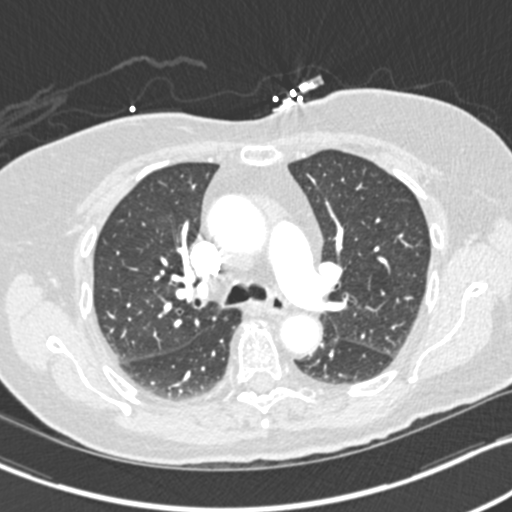
[im 184/285  soft-tissue]
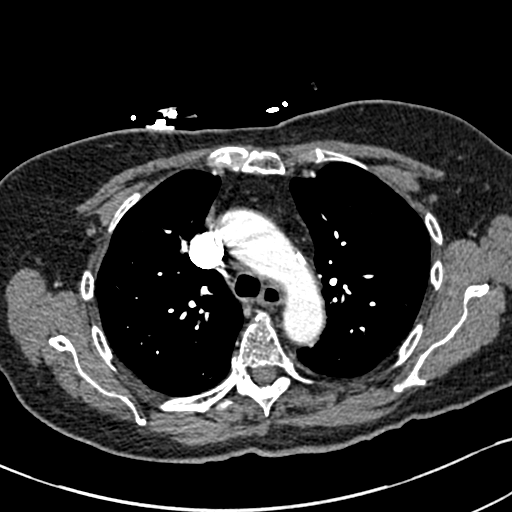
[im 201/285  lung]
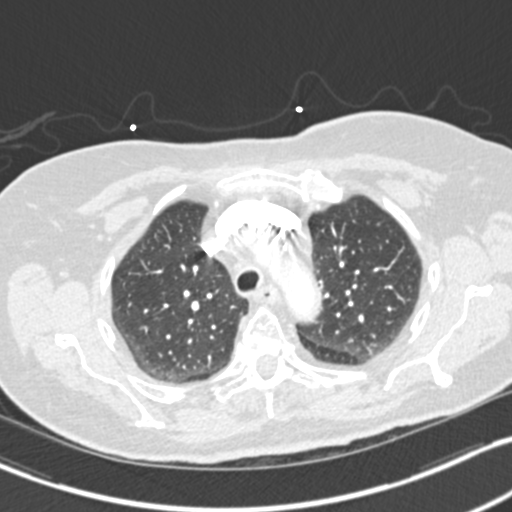
[im 218/285  soft-tissue]
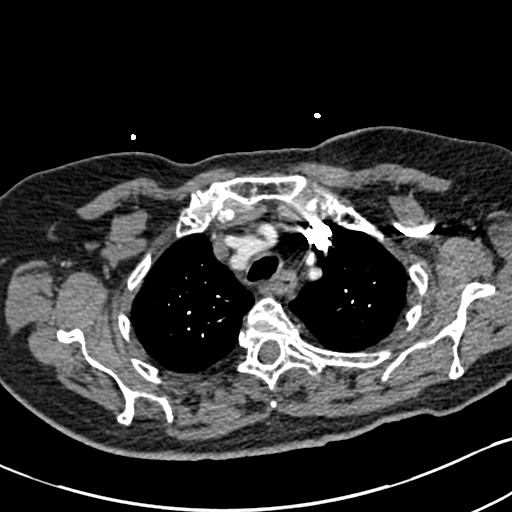
[im 234/285  lung]
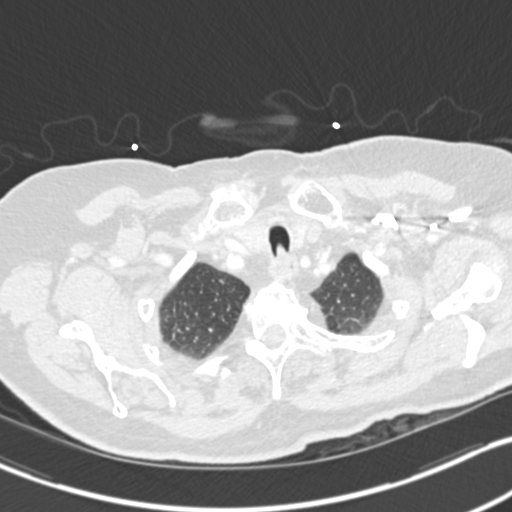
[im 251/285  soft-tissue]
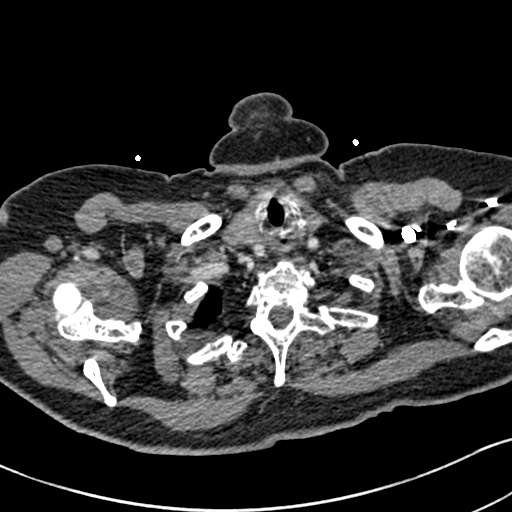
[im 268/285  lung]
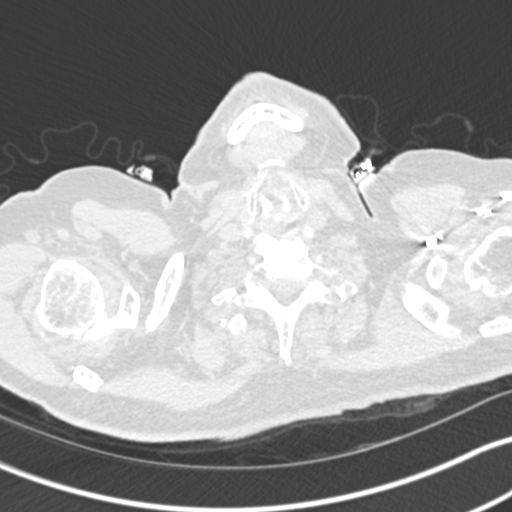

[Series 6: coronal mpr · coronal · 0.57mm/px · 3 of 146 slices shown]
[im 37/146  soft-tissue]
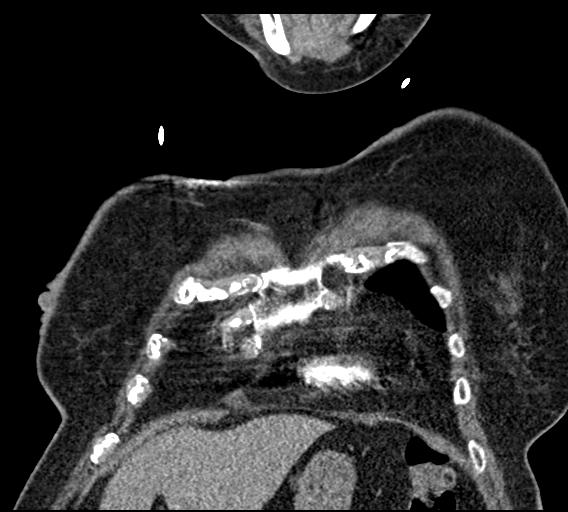
[im 73/146  soft-tissue]
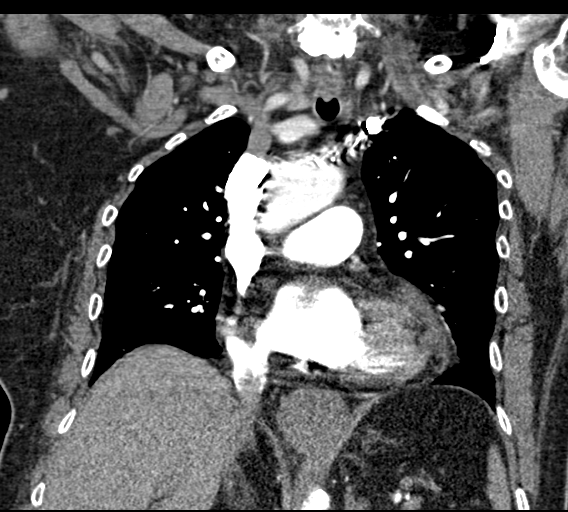
[im 109/146  soft-tissue]
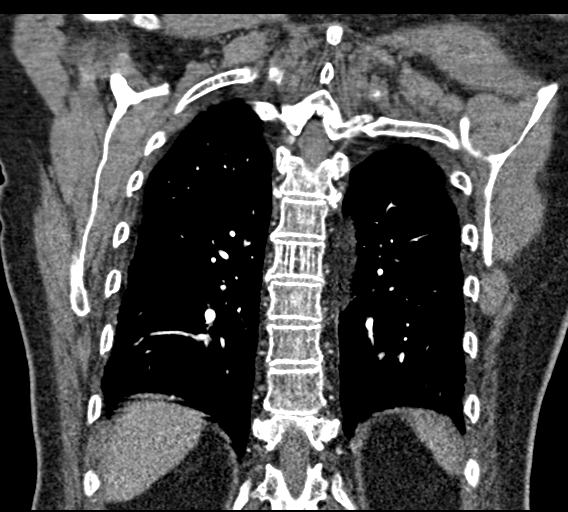

[18 of 46 positions shown; findings below may reference images not displayed]

FINDINGS: Cardiovascular: Satisfactory opacification of the pulmonary arteries
to the segmental level. No evidence of pulmonary embolism.

Heart top-normal in size. No significant pericardial effusion. No
coronary artery calcifications. Great vessels normal in caliber.
Mild aortic atherosclerotic disease. No dissection.

Mediastinum/Nodes: No neck base, axillary, mediastinal or hilar
masses or enlarged lymph nodes. Normal appearance of the thyroid.
Trachea is widely patent. Esophagus is unremarkable. Small hiatal
hernia.

Lungs/Pleura: 7 mm pleural-based right lower lobe nodule, image 90,
series 10, stable from the prior CT. No new or suspicious nodules.
No lung masses. Minor dependent subsegmental atelectasis. No
evidence of pneumonia or pulmonary edema. No pleural effusion or
pneumothorax.

Upper Abdomen: No acute abnormality.

Musculoskeletal: No fracture or acute finding. No osteoblastic or
osteolytic lesions. Bones are demineralized.

Review of the MIP images confirms the above findings.
IMPRESSION: 1. No evidence of a pulmonary embolism.
2. No acute findings.
3. 7 mm benign right lower lung nodule.

Aortic Atherosclerosis ([id number]).

## 2019-01-08 IMAGING — CR DG CHEST 2V
2 series · 2 of 2 positions shown · non-contrast
Comparison: CT 12/07/2013.

CLINICAL DATA: Sudden onset of anxiety.  Wheezing.

EXAM:
CHEST - 2 VIEW

[w chest pa]
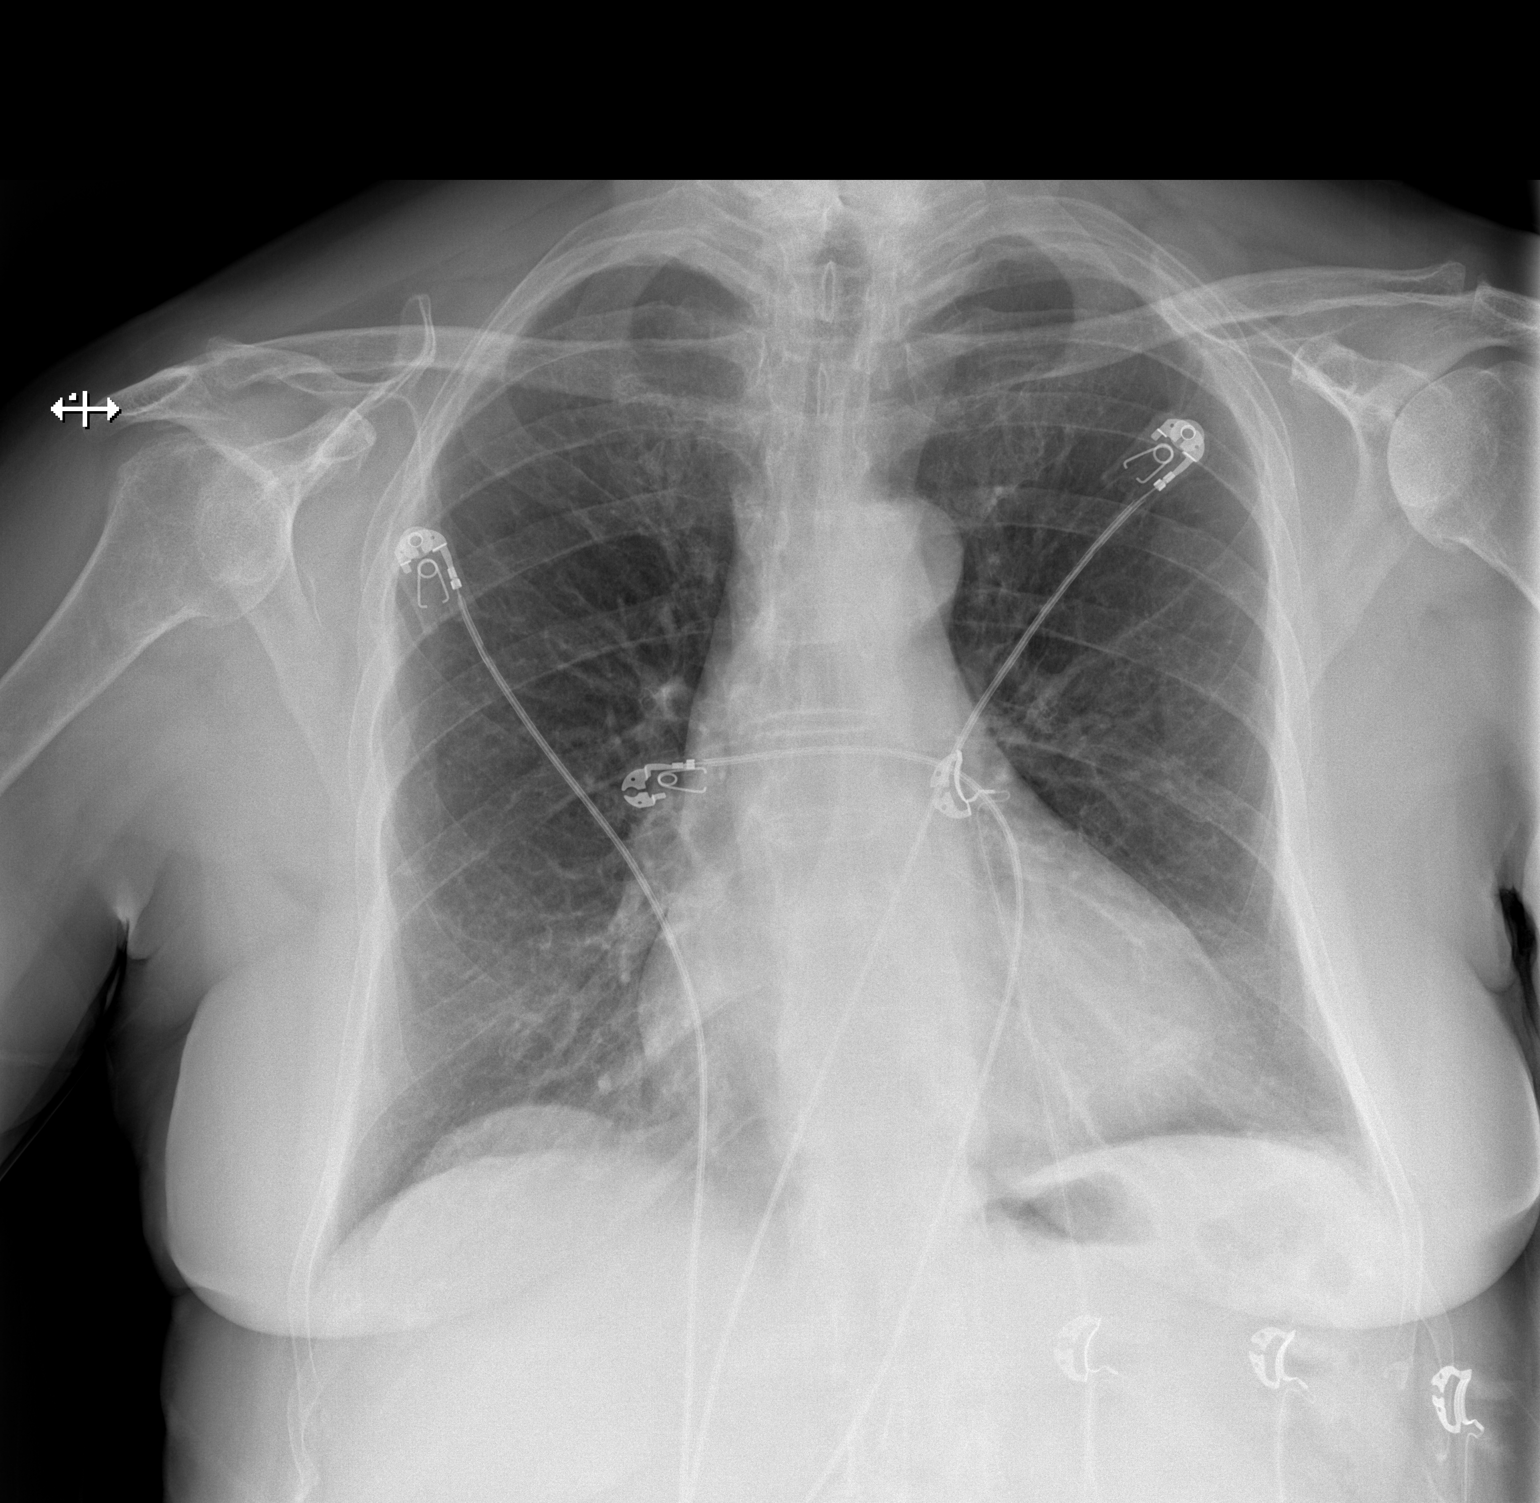

[w chest lat]
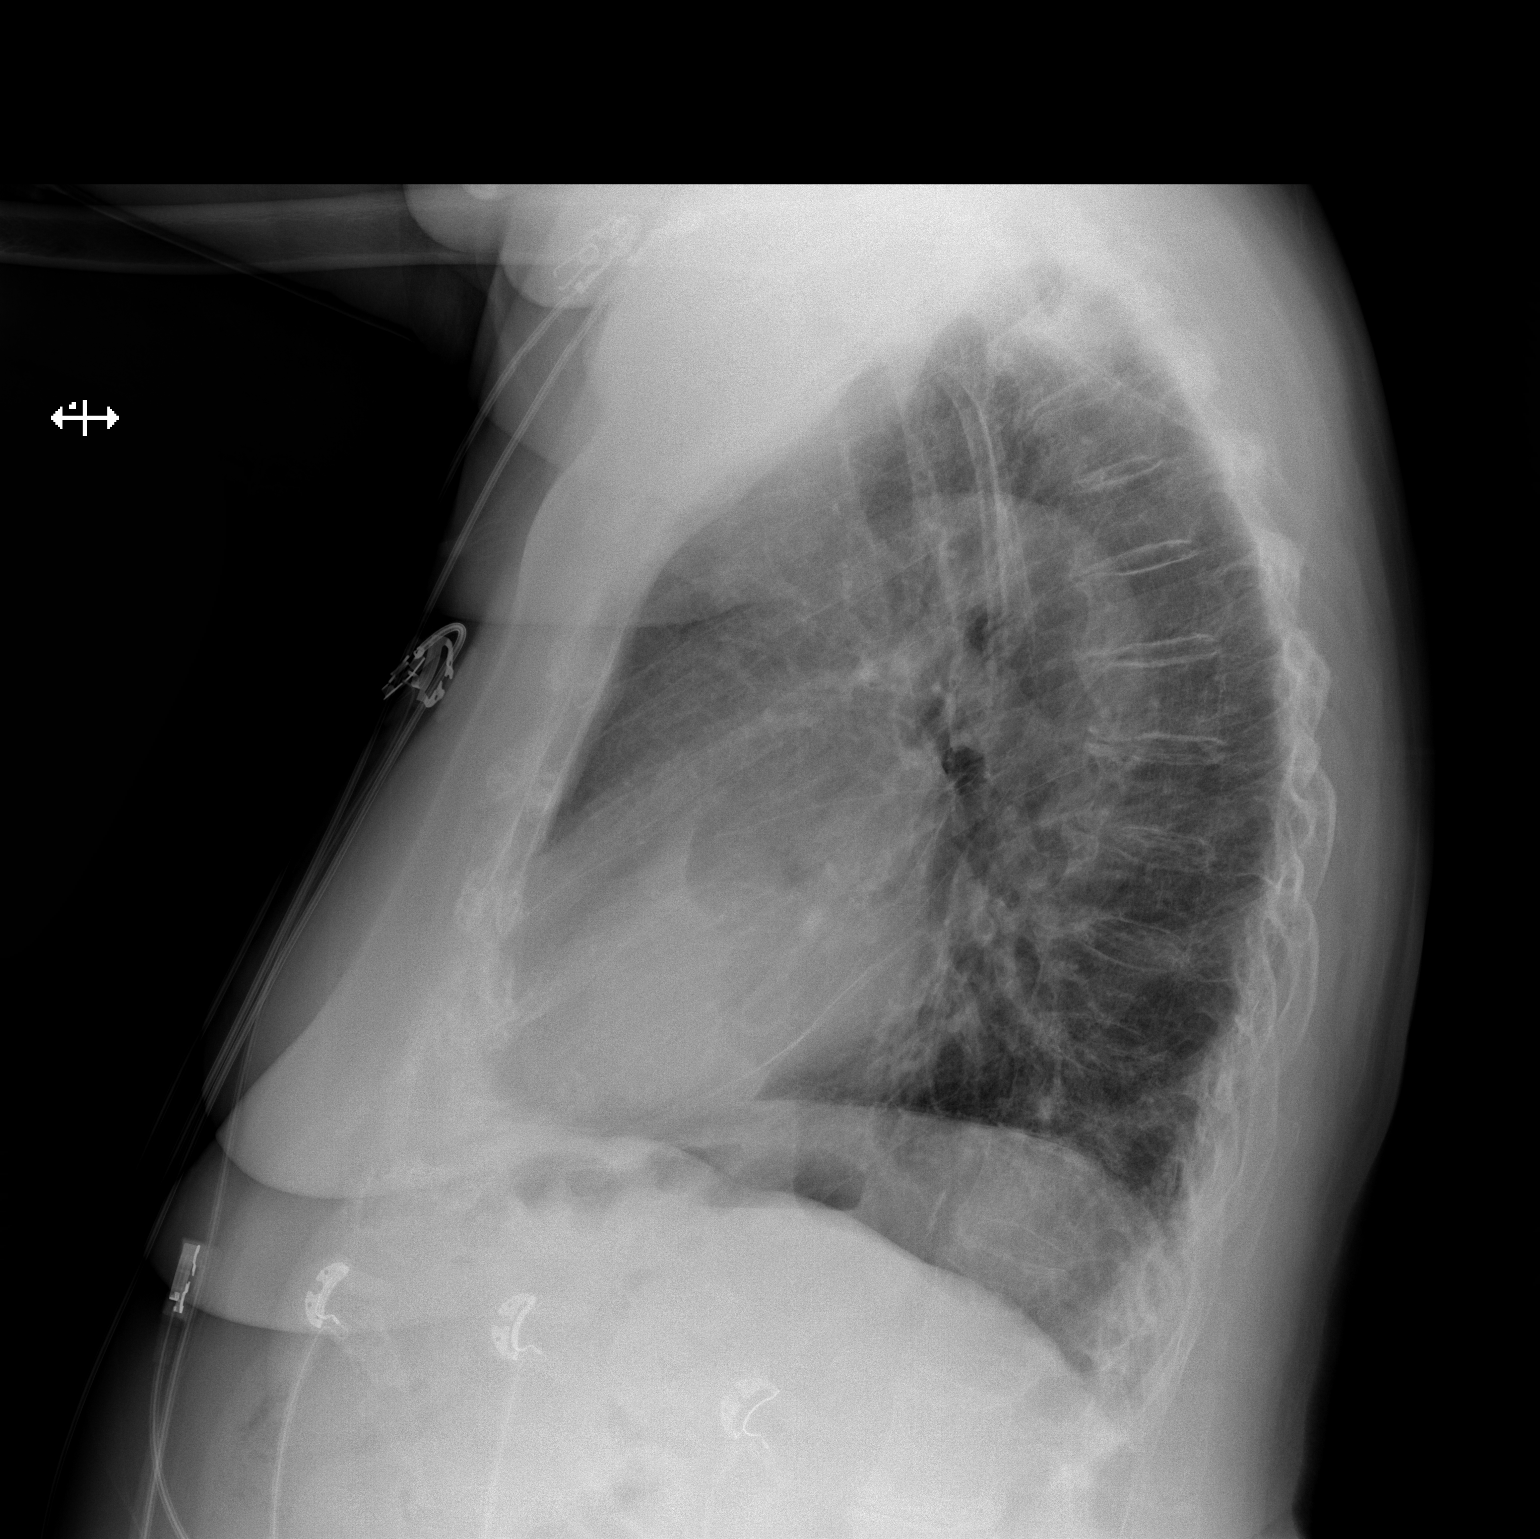

[2 of 2 positions shown; findings below may reference images not displayed]

FINDINGS: Mediastinum hilar structures normal. Lungs are clear. No pleural
effusion or pneumothorax. Cardiomegaly with normal pulmonary
vascularity. Thoracic spine scoliosis. Thoracic spine degenerative
change and osteopenia.
IMPRESSION: Cardiomegaly. No pulmonary venous congestion. No acute pulmonary
disease.

## 2019-04-04 DIAGNOSIS — H40053 Ocular hypertension, bilateral: Secondary | ICD-10-CM | POA: Diagnosis not present

## 2019-07-17 DIAGNOSIS — Z23 Encounter for immunization: Secondary | ICD-10-CM | POA: Diagnosis not present

## 2019-08-02 DIAGNOSIS — Z23 Encounter for immunization: Secondary | ICD-10-CM | POA: Diagnosis not present

## 2019-08-08 DIAGNOSIS — H40053 Ocular hypertension, bilateral: Secondary | ICD-10-CM | POA: Diagnosis not present

## 2019-12-11 DIAGNOSIS — H401111 Primary open-angle glaucoma, right eye, mild stage: Secondary | ICD-10-CM | POA: Diagnosis not present

## 2020-01-10 DIAGNOSIS — F419 Anxiety disorder, unspecified: Secondary | ICD-10-CM | POA: Diagnosis not present

## 2020-01-10 DIAGNOSIS — M5136 Other intervertebral disc degeneration, lumbar region: Secondary | ICD-10-CM | POA: Diagnosis not present

## 2020-01-10 DIAGNOSIS — Z6831 Body mass index (BMI) 31.0-31.9, adult: Secondary | ICD-10-CM | POA: Diagnosis not present

## 2020-01-10 DIAGNOSIS — R6889 Other general symptoms and signs: Secondary | ICD-10-CM | POA: Diagnosis not present

## 2020-01-10 DIAGNOSIS — E7849 Other hyperlipidemia: Secondary | ICD-10-CM | POA: Diagnosis not present

## 2020-01-10 DIAGNOSIS — F410 Panic disorder [episodic paroxysmal anxiety] without agoraphobia: Secondary | ICD-10-CM | POA: Diagnosis not present

## 2020-01-10 DIAGNOSIS — Z1389 Encounter for screening for other disorder: Secondary | ICD-10-CM | POA: Diagnosis not present

## 2020-01-10 DIAGNOSIS — Z0001 Encounter for general adult medical examination with abnormal findings: Secondary | ICD-10-CM | POA: Diagnosis not present

## 2020-01-10 DIAGNOSIS — R7309 Other abnormal glucose: Secondary | ICD-10-CM | POA: Diagnosis not present

## 2020-02-05 DIAGNOSIS — Z23 Encounter for immunization: Secondary | ICD-10-CM | POA: Diagnosis not present

## 2020-02-06 DIAGNOSIS — Z6835 Body mass index (BMI) 35.0-35.9, adult: Secondary | ICD-10-CM | POA: Diagnosis not present

## 2020-02-06 DIAGNOSIS — E6609 Other obesity due to excess calories: Secondary | ICD-10-CM | POA: Diagnosis not present

## 2020-02-06 DIAGNOSIS — M5431 Sciatica, right side: Secondary | ICD-10-CM | POA: Diagnosis not present

## 2020-02-28 DIAGNOSIS — Z23 Encounter for immunization: Secondary | ICD-10-CM | POA: Diagnosis not present

## 2020-04-09 DIAGNOSIS — H401111 Primary open-angle glaucoma, right eye, mild stage: Secondary | ICD-10-CM | POA: Diagnosis not present

## 2020-08-13 DIAGNOSIS — H40053 Ocular hypertension, bilateral: Secondary | ICD-10-CM | POA: Diagnosis not present

## 2020-08-22 ENCOUNTER — Emergency Department (HOSPITAL_COMMUNITY)
Admission: EM | Admit: 2020-08-22 | Discharge: 2020-08-22 | Disposition: A | Discharge status: Home / Self Care | Payer: Medicare Other | Attending: Emergency Medicine | Admitting: Emergency Medicine

## 2020-08-22 ENCOUNTER — Emergency Department (HOSPITAL_COMMUNITY): Payer: Medicare Other

## 2020-08-22 ENCOUNTER — Other Ambulatory Visit: Payer: Self-pay

## 2020-08-22 ENCOUNTER — Encounter (HOSPITAL_COMMUNITY): Payer: Self-pay

## 2020-08-22 DIAGNOSIS — Y9301 Activity, walking, marching and hiking: Secondary | ICD-10-CM | POA: Diagnosis not present

## 2020-08-22 DIAGNOSIS — Z96642 Presence of left artificial hip joint: Secondary | ICD-10-CM | POA: Diagnosis not present

## 2020-08-22 DIAGNOSIS — Z96641 Presence of right artificial hip joint: Secondary | ICD-10-CM | POA: Insufficient documentation

## 2020-08-22 DIAGNOSIS — S4991XA Unspecified injury of right shoulder and upper arm, initial encounter: Secondary | ICD-10-CM | POA: Diagnosis present

## 2020-08-22 DIAGNOSIS — S52501A Unspecified fracture of the lower end of right radius, initial encounter for closed fracture: Secondary | ICD-10-CM | POA: Diagnosis not present

## 2020-08-22 DIAGNOSIS — S52591A Other fractures of lower end of right radius, initial encounter for closed fracture: Secondary | ICD-10-CM | POA: Insufficient documentation

## 2020-08-22 DIAGNOSIS — W108XXA Fall (on) (from) other stairs and steps, initial encounter: Secondary | ICD-10-CM | POA: Insufficient documentation

## 2020-08-22 MED ORDER — OXYCODONE-ACETAMINOPHEN 5-325 MG PO TABS
1.0000 | ORAL_TABLET | Freq: Once | ORAL | Status: AC
  Administered 2020-08-22: 1 via ORAL
  Filled 2020-08-22: qty 1

## 2020-08-22 MED ORDER — ONDANSETRON 4 MG PO TBDP
4.0000 mg | ORAL_TABLET | Freq: Once | ORAL | Status: AC
  Administered 2020-08-22: 4 mg via ORAL
  Filled 2020-08-22: qty 1

## 2020-08-22 MED ORDER — HYDROCODONE-ACETAMINOPHEN 5-325 MG PO TABS: 1.0000 | ORAL_TABLET | Freq: Four times a day (QID) | ORAL | 0 refills | Status: DC | PRN

## 2020-08-22 NOTE — Discharge Instructions (Addendum)
You can take 1000 mg of Tylenol.  Do not exceed 4000 mg of Tylenol a day.  Take pain medications as directed for break through pain. Do not drive or operate machinery while taking this medication.  THIS PAIN MEDICATION CAN MAKE YOU DROWSY. BE CAREFUL WHEN TAKING IT.   Keep the arm elevated. Do not get the splint wet.   Call Dr. Merlyn Lot tomororw to arrange an appointment.   Return to the Emergency Dept for any worsening pain, numbness/weakness, discoloration of the hand or any other worsening or concerning symptoms.

## 2020-08-22 NOTE — Progress Notes (Signed)
Orthopedic Tech Progress Note Patient Details:  Ruth Ford [birth year] [id number]  Ortho Devices Type of Ortho Device: Sugartong splint,Arm sling Ortho Device/Splint Location: Right Arm Ortho Device/Splint Interventions: Application   Post Interventions Patient Tolerated: Well   Genelle Bal Jveon Pound 08/22/2020, 5:24 PM

## 2020-08-22 NOTE — ED Notes (Addendum)
An After Visit Summary was printed and given to the patient. Discharge instructions given and no further questions at this time.  Pt leaving with her niece.

## 2020-08-22 NOTE — ED Notes (Signed)
ED Provider at bedside. 

## 2020-08-22 NOTE — ED Triage Notes (Signed)
Patient reports that she fell approx 1 hour ago and states she tripped on something near her steps and  fell. Patient denies hitting her head LOC or taking blood thinners. Patient c/o right wrist pain.

## 2020-08-22 NOTE — ED Provider Notes (Signed)
== [facility]-EMERGENCY DEPT Provider Note   CSN: [id number] Arrival date & time: 08/22/20  1510     History Chief Complaint  Patient presents with  . Fall  . Wrist Pain    Ruth Ford is a 87 y.o. female who presents for evaluation of right arm/wrist pain that occurred after mechanical fall that occurred prior to ED arrival.  Patient reports he was outside gardening and states she was walking up the stairs and tripped and fell, landing on her right arm.  Since then she has had pain, swelling.  She states she did not hit her head or lose consciousness.  She is on a blood thinners.  She denies any preceding chest pain or dizziness prior to onset of the fall.  She has been able to ambulate since this happened.  She denies any nausea/vomiting, neck pain, numbness/weakness of arms or legs.  The history is provided by the patient.       Past Medical History:  Diagnosis Date  . Anxiety    occ  . Arthritis   . Complication of anesthesia    "goes to sleep fast"  . Distal radius fracture, left 07/08/2012  . Ear infection   . GERD (gastroesophageal reflux disease)   . Osteoarthritis of right hip 10/17/2013  . Primary localized osteoarthrosis, lower leg, left 07/08/2012  . Shortness of breath   . Vertigo     Patient Active Problem List   Diagnosis Date Noted  . Osteoarthritis of right hip 10/17/2013  . Hip arthritis 10/17/2013  . Osteoarthritis of left hip 07/08/2012  . Distal radius fracture, left 07/08/2012    Past Surgical History:  Procedure Laterality Date  . BACK SURGERY    . EYE SURGERY Bilateral    catract  . JOINT REPLACEMENT Left 07/07/12  . OPEN REDUCTION INTERNAL FIXATION (ORIF) DISTAL RADIAL FRACTURE Left 07/07/2012   Procedure: OPEN REDUCTION INTERNAL FIXATION (ORIF) DISTAL RADIAL FRACTURE;  Surgeon: Eulas Post, MD;  Location: [facility] OR;  Service: Orthopedics;  Laterality: Left;  . TOTAL HIP ARTHROPLASTY Left 07/07/2012   Procedure: TOTAL HIP  ARTHROPLASTY;  Surgeon: Eulas Post, MD;  Location: [facility] OR;  Service: Orthopedics;  Laterality: Left;  . TOTAL HIP ARTHROPLASTY Right 10/17/2013   Procedure: TOTAL HIP ARTHROPLASTY;  Surgeon: Eulas Post, MD;  Location: [facility] OR;  Service: Orthopedics;  Laterality: Right;     OB History   No obstetric history on file.     History reviewed. No pertinent family history.  Social History   Tobacco Use  . Smoking status: Never Smoker  . Smokeless tobacco: Never Used  Vaping Use  . Vaping Use: Never used  Substance Use Topics  . Alcohol use: No  . Drug use: No    Home Medications Prior to Admission medications   Medication Sig Start Date End Date Taking? Authorizing Provider  HYDROcodone-acetaminophen (NORCO/VICODIN) 5-325 MG tablet Take 1 tablet by mouth every 6 (six) hours as needed. 08/22/20  Yes Graciella Freer A, PA-C  dorzolamide-timolol (COSOPT) 22.3-6.8 MG/ML ophthalmic solution Place 1 drop into both eyes 2 (two) times daily. 07/04/17   [provider]  escitalopram (LEXAPRO) 10 MG tablet Take 10 mg by mouth daily. 08/03/17   [provider]  hydrochlorothiazide (HYDRODIURIL) 25 MG tablet Take 1 tablet (25 mg total) by mouth daily. Patient not taking: Reported on 07/28/2017 04/06/16   Dione Booze, MD  LORazepam (ATIVAN) 0.5 MG tablet Take 0.25 mg by mouth 3 (three) times  daily.  06/30/17   [provider]  pravastatin (PRAVACHOL) 20 MG tablet Take 20 mg by mouth daily. 08/11/17   [provider]    Allergies    Meclizine  Review of Systems   Review of Systems  Eyes: Negative for visual disturbance.  Gastrointestinal: Negative for nausea and vomiting.  Musculoskeletal:       Right arm pain  Neurological: Negative for weakness and numbness.  All other systems reviewed and are negative.   Physical Exam Updated Vital Signs BP (!) 151/95   Pulse 76   Temp 98.5 F (36.9 C) (Oral)   Resp 18   Ht 5' 5.5" (1.664 m)   Wt 85.9 kg   SpO2  98%   BMI 31.02 kg/m   Physical Exam Vitals and nursing note reviewed.  Constitutional:      Appearance: She is well-developed.  HENT:     Head: Normocephalic and atraumatic.     Comments: No tenderness to palpation of skull. No deformities or crepitus noted. No open wounds, abrasions or lacerations.  Eyes:     General: No scleral icterus.       Right eye: No discharge.        Left eye: No discharge.     Conjunctiva/sclera: Conjunctivae normal.     Comments: PERRL. EOMs intact. No nystagmus. No neglect.   Neck:     Comments: Full flexion/extension and lateral movement of neck fully intact. No bony midline tenderness. No deformities or crepitus.  Cardiovascular:     Pulses:          Radial pulses are 2+ on the right side and 2+ on the left side.  Pulmonary:     Effort: Pulmonary effort is normal.  Musculoskeletal:     Comments: Tenderness to palpation noted to the right distal forearm and wrist with overlying deformity, swelling noted.  No bony tenderness noted to the right elbow, right shoulder.  Flexion/tension of right elbow and full range of motion of right shoulder intact by difficulty.  No bony tenderness noted to left upper extremity.  No pelvic instability. Palpation of the bilateral lower extremities.+  Skin:    General: Skin is warm and dry.     Comments: Good distal cap refill. RUE is not dusky in appearance or cool to touch.  Neurological:     Mental Status: She is alert.     Comments: Cranial nerves III-XII intact Follows commands, Moves all extremities  5/5 strength to LUE and BLE. Exam of RUE limited to pain.  Equal grip strength of BUE.  Sensation intact throughout all major nerve distributions No slurred speech. No facial droop.  Alert and oriented x 3.   Psychiatric:        Speech: Speech normal.        Behavior: Behavior normal.     ED Results / Procedures / Treatments   Labs (all labs ordered are listed, but only abnormal results are displayed) Labs  Reviewed - No data to display  EKG None  Radiology DG Forearm Right  Result Date: 08/22/2020 CLINICAL DATA:  87 year old female with fall. EXAM: RIGHT WRIST - COMPLETE 3+ VIEW; RIGHT FOREARM - 2 VIEW COMPARISON:  None. FINDINGS: There is a displaced oblique fracture of the distal radius extending from the medial aspect of the radial metaphysis to the distal diaphysis. The fracture appears comminuted. There is mild volar angulation of the distal fracture fragment. Evaluation for intra-articular extension is limited. There is no dislocation. There is  advanced osteopenia. There is chronic degenerative changes with narrowing of the radiocarpal distance and chondrocalcinosis. There is mild soft tissue swelling of the wrist. No radiopaque foreign object or soft tissue gas. IMPRESSION: Mildly displaced and angulated fracture of the distal radius. No dislocation. Electronically Signed   By: Elgie CollardArash  Radparvar M.D.   On: 08/22/2020 16:33   DG Wrist Complete Right  Result Date: 08/22/2020 CLINICAL DATA:  87 year old female with fall. EXAM: RIGHT WRIST - COMPLETE 3+ VIEW; RIGHT FOREARM - 2 VIEW COMPARISON:  None. FINDINGS: There is a displaced oblique fracture of the distal radius extending from the medial aspect of the radial metaphysis to the distal diaphysis. The fracture appears comminuted. There is mild volar angulation of the distal fracture fragment. Evaluation for intra-articular extension is limited. There is no dislocation. There is advanced osteopenia. There is chronic degenerative changes with narrowing of the radiocarpal distance and chondrocalcinosis. There is mild soft tissue swelling of the wrist. No radiopaque foreign object or soft tissue gas. IMPRESSION: Mildly displaced and angulated fracture of the distal radius. No dislocation. Electronically Signed   By: Elgie CollardArash  Radparvar M.D.   On: 08/22/2020 16:33    Procedures Procedures   Medications Ordered in ED Medications  oxyCODONE-acetaminophen  (PERCOCET/ROXICET) 5-325 MG per tablet 1 tablet (1 tablet Oral Given 08/22/20 1538)  ondansetron (ZOFRAN-ODT) disintegrating tablet 4 mg (4 mg Oral Given 08/22/20 1539)  oxyCODONE-acetaminophen (PERCOCET/ROXICET) 5-325 MG per tablet 1 tablet (1 tablet Oral Given 08/22/20 1800)    ED Course  I have reviewed the triage vital signs and the nursing notes.  Pertinent labs & imaging results that were available during my care of the patient were reviewed by me and considered in my medical decision making (see chart for details).    MDM Rules/Calculators/A&P                          87 year old female who presents for evaluation of right arm/wrist pain after mechanical fall that occurred earlier today.  Did not hit her head.  States she no LOC, not on blood thinners.  Reports pain to her right arm, right wrist.  No numbness/weakness.  On initial arrival, she is afebrile nontoxic-appearing.  Vital signs are stable.  On exam, she has tenderness palpation on distal right arm/right wrist with overlying deformity, swelling.  She is neurovascularly intact with 2+ radial pulse BUE.  Patient with no signs of head trauma.  Normal neuro exam.  We will plan for imaging of arm for concern for fracture versus sprain versus dislocation.  Patient states she not hit her head or have any LOC.  She has no signs of trauma and has normal neuro exam here in the ED.  No negation for head CT at this time.  XR shows mildly displaced and angulated fracture of the distal radius.  No dislocation.  Discussed with Dr. Merlyn LotKuzma (hand).  Does not recommend reduction here at this time given that patient is neurovascularly intact with good pulses.  Recommends splinting with plans for follow-up.  Reevaluation after splint placement.  Patient with good cap refill.  She is still having some pain will give additional analgesics.  Instruction patient on at home supportive care measures.  Will give short course of pain medication for her to go home  with.  Patient instructed follow-up with hand as directed. At this time, patient exhibits no emergent life-threatening condition that require further evaluation in ED. Discussed patient with Dr. Stevie Kernykstra who  is in agreement to plan. Patient had ample opportunity for questions and discussion. All patient's questions were answered with full understanding. Strict return precautions discussed. Patient expresses understanding and agreement to plan.   Portions of this note were generated with Scientist, clinical (histocompatibility and immunogenetics). Dictation errors may occur despite best attempts at proofreading.   Final Clinical Impression(s) / ED Diagnoses Final diagnoses:  Other closed fracture of distal end of right radius, initial encounter    Rx / DC Orders ED Discharge Orders         Ordered    HYDROcodone-acetaminophen (NORCO/VICODIN) 5-325 MG tablet  Every 6 hours PRN        08/22/20 1755           Maxwell Caul, PA-C 08/22/20 2020    Milagros Loll, MD 08/22/20 2222

## 2020-08-23 ENCOUNTER — Other Ambulatory Visit: Payer: Self-pay | Admitting: Orthopedic Surgery

## 2020-08-23 ENCOUNTER — Encounter (HOSPITAL_BASED_OUTPATIENT_CLINIC_OR_DEPARTMENT_OTHER): Payer: Self-pay | Admitting: Orthopedic Surgery

## 2020-08-23 ENCOUNTER — Encounter (HOSPITAL_BASED_OUTPATIENT_CLINIC_OR_DEPARTMENT_OTHER)
Admission: RE | Disposition: A | Discharge status: Home / Self Care | Payer: Self-pay | Source: Ambulatory Visit | Attending: Orthopedic Surgery

## 2020-08-23 ENCOUNTER — Other Ambulatory Visit: Payer: Self-pay

## 2020-08-23 ENCOUNTER — Ambulatory Visit (HOSPITAL_BASED_OUTPATIENT_CLINIC_OR_DEPARTMENT_OTHER): Payer: Medicare Other | Admitting: Anesthesiology

## 2020-08-23 ENCOUNTER — Encounter (HOSPITAL_BASED_OUTPATIENT_CLINIC_OR_DEPARTMENT_OTHER)
Admission: RE | Admit: 2020-08-23 | Discharge: 2020-08-23 | Disposition: A | Discharge status: Home / Self Care | Payer: Medicare Other | Source: Ambulatory Visit | Attending: Orthopedic Surgery | Admitting: Orthopedic Surgery

## 2020-08-23 ENCOUNTER — Ambulatory Visit (HOSPITAL_BASED_OUTPATIENT_CLINIC_OR_DEPARTMENT_OTHER)
Admission: RE | Admit: 2020-08-23 | Discharge: 2020-08-23 | Disposition: A | Discharge status: Home / Self Care | Payer: Medicare Other | Source: Ambulatory Visit | Attending: Orthopedic Surgery | Admitting: Orthopedic Surgery

## 2020-08-23 DIAGNOSIS — K219 Gastro-esophageal reflux disease without esophagitis: Secondary | ICD-10-CM | POA: Insufficient documentation

## 2020-08-23 DIAGNOSIS — S52551K Other extraarticular fracture of lower end of right radius, subsequent encounter for closed fracture with nonunion: Secondary | ICD-10-CM | POA: Diagnosis present

## 2020-08-23 DIAGNOSIS — F419 Anxiety disorder, unspecified: Secondary | ICD-10-CM | POA: Diagnosis not present

## 2020-08-23 DIAGNOSIS — S52571A Other intraarticular fracture of lower end of right radius, initial encounter for closed fracture: Secondary | ICD-10-CM | POA: Diagnosis not present

## 2020-08-23 DIAGNOSIS — S52551A Other extraarticular fracture of lower end of right radius, initial encounter for closed fracture: Secondary | ICD-10-CM | POA: Insufficient documentation

## 2020-08-23 DIAGNOSIS — Y939 Activity, unspecified: Secondary | ICD-10-CM | POA: Insufficient documentation

## 2020-08-23 DIAGNOSIS — Z888 Allergy status to other drugs, medicaments and biological substances status: Secondary | ICD-10-CM | POA: Diagnosis not present

## 2020-08-23 DIAGNOSIS — W19XXXA Unspecified fall, initial encounter: Secondary | ICD-10-CM | POA: Diagnosis not present

## 2020-08-23 DIAGNOSIS — M160 Bilateral primary osteoarthritis of hip: Secondary | ICD-10-CM | POA: Diagnosis not present

## 2020-08-23 DIAGNOSIS — Z20822 Contact with and (suspected) exposure to covid-19: Secondary | ICD-10-CM | POA: Insufficient documentation

## 2020-08-23 HISTORY — PX: OPEN REDUCTION INTERNAL FIXATION (ORIF) DISTAL RADIAL FRACTURE: SHX5989

## 2020-08-23 LAB — CBC
HCT: 43.8 % (ref 36.0–46.0)
Hemoglobin: 14.4 g/dL (ref 12.0–15.0)
MCH: 30.8 pg (ref 26.0–34.0)
MCHC: 32.9 g/dL (ref 30.0–36.0)
MCV: 93.8 fL (ref 80.0–100.0)
Platelets: 161 10*3/uL (ref 150–400)
RBC: 4.67 MIL/uL (ref 3.87–5.11)
RDW: 13.2 % (ref 11.5–15.5)
WBC: 8.3 10*3/uL (ref 4.0–10.5)
nRBC: 0 % (ref 0.0–0.2)

## 2020-08-23 LAB — BASIC METABOLIC PANEL
Anion gap: 5 (ref 5–15)
BUN: 15 mg/dL (ref 8–23)
CO2: 28 mmol/L (ref 22–32)
Calcium: 10 mg/dL (ref 8.9–10.3)
Chloride: 105 mmol/L (ref 98–111)
Creatinine, Ser: 1.07 mg/dL — ABNORMAL HIGH (ref 0.44–1.00)
GFR, Estimated: 51 mL/min — ABNORMAL LOW (ref >60)
Glucose, Bld: 121 mg/dL — ABNORMAL HIGH (ref 70–99)
Potassium: 4 mmol/L (ref 3.5–5.1)
Sodium: 138 mmol/L (ref 135–145)

## 2020-08-23 LAB — SARS CORONAVIRUS 2 BY RT PCR (HOSPITAL ORDER, PERFORMED IN ~~LOC~~ HOSPITAL LAB): SARS Coronavirus 2: NEGATIVE

## 2020-08-23 SURGERY — OPEN REDUCTION INTERNAL FIXATION (ORIF) DISTAL RADIUS FRACTURE
Anesthesia: Monitor Anesthesia Care | Site: Wrist | Laterality: Right

## 2020-08-23 MED ORDER — ONDANSETRON HCL 4 MG/2ML IJ SOLN
INTRAMUSCULAR | Status: DC | PRN
  Administered 2020-08-23: 4 mg via INTRAVENOUS

## 2020-08-23 MED ORDER — LIDOCAINE 2% (20 MG/ML) 5 ML SYRINGE
INTRAMUSCULAR | Status: DC | PRN
  Administered 2020-08-23: 30 mg via INTRAVENOUS

## 2020-08-23 MED ORDER — OXYCODONE-ACETAMINOPHEN 5-325 MG PO TABS: ORAL_TABLET | ORAL | 0 refills | Status: AC

## 2020-08-23 MED ORDER — OXYCODONE HCL 5 MG/5ML PO SOLN: 5.0000 mg | Freq: Once | ORAL | Status: DC | PRN

## 2020-08-23 MED ORDER — PROPOFOL 500 MG/50ML IV EMUL
INTRAVENOUS | Status: DC | PRN
  Administered 2020-08-23: 75 ug/kg/min via INTRAVENOUS

## 2020-08-23 MED ORDER — CEFAZOLIN SODIUM-DEXTROSE 2-4 GM/100ML-% IV SOLN
2.0000 g | INTRAVENOUS | Status: AC
  Administered 2020-08-23: 2 g via INTRAVENOUS

## 2020-08-23 MED ORDER — CEFAZOLIN SODIUM-DEXTROSE 2-4 GM/100ML-% IV SOLN
INTRAVENOUS | Status: AC
  Filled 2020-08-23: qty 100

## 2020-08-23 MED ORDER — FENTANYL CITRATE (PF) 100 MCG/2ML IJ SOLN: 25.0000 ug | INTRAMUSCULAR | Status: DC | PRN

## 2020-08-23 MED ORDER — MIDAZOLAM HCL 2 MG/2ML IJ SOLN
INTRAMUSCULAR | Status: AC
  Filled 2020-08-23: qty 2

## 2020-08-23 MED ORDER — FENTANYL CITRATE (PF) 100 MCG/2ML IJ SOLN
100.0000 ug | Freq: Once | INTRAMUSCULAR | Status: AC
  Administered 2020-08-23: 50 ug via INTRAVENOUS

## 2020-08-23 MED ORDER — LACTATED RINGERS IV SOLN: INTRAVENOUS | Status: DC | PRN

## 2020-08-23 MED ORDER — ONDANSETRON HCL 4 MG/2ML IJ SOLN
4.0000 mg | Freq: Once | INTRAMUSCULAR | Status: AC | PRN
  Administered 2020-08-23: 4 mg via INTRAVENOUS

## 2020-08-23 MED ORDER — FENTANYL CITRATE (PF) 100 MCG/2ML IJ SOLN
INTRAMUSCULAR | Status: AC
  Filled 2020-08-23: qty 2

## 2020-08-23 MED ORDER — OXYCODONE HCL 5 MG PO TABS
5.0000 mg | ORAL_TABLET | Freq: Once | ORAL | Status: DC | PRN
Start: 2020-08-23 — End: 2020-08-23

## 2020-08-23 SURGICAL SUPPLY — 58 items
APL PRP STRL LF DISP 70% ISPRP (MISCELLANEOUS) ×1
BIT DRILL 2.0 LNG QUCK RELEASE (BIT) ×1 IMPLANT
BIT DRILL QC 2.8X5 (BIT) ×2 IMPLANT
BLADE SURG 15 STRL LF DISP TIS (BLADE) ×2 IMPLANT
BLADE SURG 15 STRL SS (BLADE) ×4
BNDG CMPR 9X4 STRL LF SNTH (GAUZE/BANDAGES/DRESSINGS) ×1
BNDG ELASTIC 3X5.8 VLCR STR LF (GAUZE/BANDAGES/DRESSINGS) ×2 IMPLANT
BNDG ESMARK 4X9 LF (GAUZE/BANDAGES/DRESSINGS) ×2 IMPLANT
BNDG GAUZE ELAST 4 BULKY (GAUZE/BANDAGES/DRESSINGS) ×2 IMPLANT
BNDG PLASTER X FAST 3X3 WHT LF (CAST SUPPLIES) ×20 IMPLANT
BNDG PLSTR 9X3 FST ST WHT (CAST SUPPLIES) ×10
CHLORAPREP W/TINT 26 (MISCELLANEOUS) ×2 IMPLANT
CORD BIPOLAR FORCEPS 12FT (ELECTRODE) ×2 IMPLANT
COVER BACK TABLE 60X90IN (DRAPES) ×2 IMPLANT
COVER MAYO STAND STRL (DRAPES) ×2 IMPLANT
COVER WAND RF STERILE (DRAPES) IMPLANT
CUFF TOURN SGL QUICK 18X4 (TOURNIQUET CUFF) ×2 IMPLANT
CUFF TOURN SGL QUICK 24 (TOURNIQUET CUFF)
CUFF TRNQT CYL 24X4X16.5-23 (TOURNIQUET CUFF) IMPLANT
DRAPE EXTREMITY T 121X128X90 (DISPOSABLE) ×2 IMPLANT
DRAPE OEC MINIVIEW 54X84 (DRAPES) ×2 IMPLANT
DRAPE SURG 17X23 STRL (DRAPES) ×2 IMPLANT
DRILL 2.0 LNG QUICK RELEASE (BIT) ×2
GAUZE SPONGE 4X4 12PLY STRL (GAUZE/BANDAGES/DRESSINGS) ×2 IMPLANT
GAUZE XEROFORM 1X8 LF (GAUZE/BANDAGES/DRESSINGS) ×2 IMPLANT
GLOVE SRG 8 PF TXTR STRL LF DI (GLOVE) ×1 IMPLANT
GLOVE SURG ENC MOIS LTX SZ7.5 (GLOVE) ×2 IMPLANT
GLOVE SURG ORTHO LTX SZ8 (GLOVE) ×2 IMPLANT
GLOVE SURG UNDER POLY LF SZ8 (GLOVE) ×2
GLOVE SURG UNDER POLY LF SZ8.5 (GLOVE) ×2 IMPLANT
GOWN STRL REUS W/ TWL LRG LVL3 (GOWN DISPOSABLE) ×1 IMPLANT
GOWN STRL REUS W/TWL LRG LVL3 (GOWN DISPOSABLE) ×2
GOWN STRL REUS W/TWL XL LVL3 (GOWN DISPOSABLE) ×6 IMPLANT
GUIDEWIRE ORTHO 0.054X6 (WIRE) ×6 IMPLANT
GUIDEWIRE ORTHO MICROSHT  ACUT (WIRE) ×2
GUIDEWIRE ORTHO MICROSHT .035 (WIRE) ×2 IMPLANT
NEEDLE HYPO 25X1 1.5 SAFETY (NEEDLE) IMPLANT
NS IRRIG 1000ML POUR BTL (IV SOLUTION) ×2 IMPLANT
PACK BASIN DAY SURGERY FS (CUSTOM PROCEDURE TRAY) ×2 IMPLANT
PAD CAST 3X4 CTTN HI CHSV (CAST SUPPLIES) ×1 IMPLANT
PADDING CAST COTTON 3X4 STRL (CAST SUPPLIES) ×2
PLATE R LONG NARROW PROX (Plate) ×2 IMPLANT
SCREW CORT FT 18X2.3XLCK HEX (Screw) ×2 IMPLANT
SCREW CORT FX14X2.3XLCK NS (Screw) ×3 IMPLANT
SCREW CORTICAL 2.3X14 (Screw) ×2 IMPLANT
SCREW CORTICAL LOCKING 2.3X14M (Screw) ×6 IMPLANT
SCREW CORTICAL LOCKING 2.3X18M (Screw) ×6 IMPLANT
SCREW FX18X2.3XSMTH LCK NS CRT (Screw) ×1 IMPLANT
SCREW NON TOGG 2.3X18MM (Screw) ×4 IMPLANT
SCREW NONLOCK HEX 3.5X12 (Screw) ×6 IMPLANT
SLEEVE SCD COMPRESS KNEE MED (STOCKING) ×2 IMPLANT
STOCKINETTE 4X48 STRL (DRAPES) ×2 IMPLANT
SUT ETHILON 4 0 PS 2 18 (SUTURE) ×4 IMPLANT
SUT VICRYL 4-0 PS2 18IN ABS (SUTURE) ×2 IMPLANT
SYR BULB EAR ULCER 3OZ GRN STR (SYRINGE) ×2 IMPLANT
SYR CONTROL 10ML LL (SYRINGE) IMPLANT
TOWEL GREEN STERILE FF (TOWEL DISPOSABLE) ×4 IMPLANT
UNDERPAD 30X36 HEAVY ABSORB (UNDERPADS AND DIAPERS) ×2 IMPLANT

## 2020-08-23 NOTE — Op Note (Signed)
I assisted Surgeon(s) and Role:    * [name], MD - Primary    Cindee Salt, MD - Assisting on the Procedure(s): OPEN REDUCTION INTERNAL FIXATION (ORIF) RIGHT DISTAL RADIAL FRACTURE on 08/23/2020.  I provided assistance on this case as follows: setup, approach,identification, debridement, reduction, placement of interfragmentary pins, stabilization, fixation of the fracture with plates and screw, plus fragmentary screws,  Closure of the wound and application of the dressings and splint.  Electronically signed by: Cindee Salt, MD Date: 08/23/2020 Time: 4:55 PM

## 2020-08-23 NOTE — Progress Notes (Signed)
Assisted Dr. Joslin with right, ultrasound guided, supraclavicular block. Side rails up, monitors on throughout procedure. See vital signs in flow sheet. Tolerated Procedure well. 

## 2020-08-23 NOTE — H&P (Addendum)
Ruth Ford is an 87 y.o. female.   Chief Complaint: wrist fracture HPI: 87 yo female states she fell in yard last night injuring right wrist.  Seen at [facility] where XR revealed right distal radius fracture.  Splinted and followed up in office.  She wishes to proceed with operative treatment of right distal radius fracture.  Allergies:  Allergies  Allergen Reactions  . Meclizine Other (See Comments)    Worsened dizziness/felt spaced out    Past Medical History:  Diagnosis Date  . Anxiety    occ  . Arthritis   . Complication of anesthesia    "goes to sleep fast"  . Distal radius fracture, left 07/08/2012  . Ear infection   . GERD (gastroesophageal reflux disease)   . Osteoarthritis of right hip 10/17/2013  . Primary localized osteoarthrosis, lower leg, left 07/08/2012  . Shortness of breath   . Vertigo     Past Surgical History:  Procedure Laterality Date  . BACK SURGERY    . EYE SURGERY Bilateral    catract  . JOINT REPLACEMENT Left 07/07/12  . OPEN REDUCTION INTERNAL FIXATION (ORIF) DISTAL RADIAL FRACTURE Left 07/07/2012   Procedure: OPEN REDUCTION INTERNAL FIXATION (ORIF) DISTAL RADIAL FRACTURE;  Surgeon: Eulas Post, MD;  Location: [facility] OR;  Service: Orthopedics;  Laterality: Left;  . TOTAL HIP ARTHROPLASTY Left 07/07/2012   Procedure: TOTAL HIP ARTHROPLASTY;  Surgeon: Eulas Post, MD;  Location: [facility] OR;  Service: Orthopedics;  Laterality: Left;  . TOTAL HIP ARTHROPLASTY Right 10/17/2013   Procedure: TOTAL HIP ARTHROPLASTY;  Surgeon: Eulas Post, MD;  Location: [facility] OR;  Service: Orthopedics;  Laterality: Right;    Family History: No family history on file.  Social History:   reports that she has never smoked. She has never used smokeless tobacco. She reports that she does not drink alcohol and does not use drugs.  Medications: No medications prior to admission.    Results for orders placed or performed during the hospital encounter of 08/23/20 (from the past 48 hour(s))   Basic metabolic panel     Status: Abnormal   Collection Time: 08/23/20 11:44 AM  Result Value Ref Range   Sodium 138 135 - 145 mmol/L   Potassium 4.0 3.5 - 5.1 mmol/L   Chloride 105 98 - 111 mmol/L   CO2 28 22 - 32 mmol/L   Glucose, Bld 121 (H) 70 - 99 mg/dL    Comment: Glucose reference range applies only to samples taken after fasting for at least 8 hours.   BUN 15 8 - 23 mg/dL   Creatinine, Ser 4.97 (H) 0.44 - 1.00 mg/dL   Calcium 53.0 8.9 - 05.1 mg/dL   GFR, Estimated 51 (L) >60 mL/min    Comment: (NOTE) Calculated using the CKD-EPI Creatinine Equation (2021)    Anion gap 5 5 - 15    Comment: Performed at [facility], [address] [place], [place] [address]  CBC     Status: None   Collection Time: 08/23/20 11:44 AM  Result Value Ref Range   WBC 8.3 4.0 - 10.5 K/uL   RBC 4.67 3.87 - 5.11 MIL/uL   Hemoglobin 14.4 12.0 - 15.0 g/dL   HCT 17.3 56.7 - 01.4 %   MCV 93.8 80.0 - 100.0 fL   MCH 30.8 26.0 - 34.0 pg   MCHC 32.9 30.0 - 36.0 g/dL   RDW 10.3 01.3 - 14.3 %   Platelets 161 150 - 400 K/uL  nRBC 0.0 0.0 - 0.2 %    Comment: Performed at [facility], [address], [place], [place] [address]    DG Forearm Right  Result Date: 08/22/2020 CLINICAL DATA:  87 year old female with fall. EXAM: RIGHT WRIST - COMPLETE 3+ VIEW; RIGHT FOREARM - 2 VIEW COMPARISON:  None. FINDINGS: There is a displaced oblique fracture of the distal radius extending from the medial aspect of the radial metaphysis to the distal diaphysis. The fracture appears comminuted. There is mild volar angulation of the distal fracture fragment. Evaluation for intra-articular extension is limited. There is no dislocation. There is advanced osteopenia. There is chronic degenerative changes with narrowing of the radiocarpal distance and chondrocalcinosis. There is mild soft tissue swelling of the wrist. No radiopaque foreign object or soft tissue gas. IMPRESSION: Mildly displaced and angulated fracture  of the distal radius. No dislocation. Electronically Signed   By: Elgie Collard M.D.   On: 08/22/2020 16:33   DG Wrist Complete Right  Result Date: 08/22/2020 CLINICAL DATA:  87 year old female with fall. EXAM: RIGHT WRIST - COMPLETE 3+ VIEW; RIGHT FOREARM - 2 VIEW COMPARISON:  None. FINDINGS: There is a displaced oblique fracture of the distal radius extending from the medial aspect of the radial metaphysis to the distal diaphysis. The fracture appears comminuted. There is mild volar angulation of the distal fracture fragment. Evaluation for intra-articular extension is limited. There is no dislocation. There is advanced osteopenia. There is chronic degenerative changes with narrowing of the radiocarpal distance and chondrocalcinosis. There is mild soft tissue swelling of the wrist. No radiopaque foreign object or soft tissue gas. IMPRESSION: Mildly displaced and angulated fracture of the distal radius. No dislocation. Electronically Signed   By: Elgie Collard M.D.   On: 08/22/2020 16:33     A comprehensive review of systems was negative.  There were no vitals taken for this visit.  General appearance: alert, cooperative and appears stated age Head: Normocephalic, without obvious abnormality, atraumatic Neck: supple, symmetrical, trachea midline Cardio: regular rate and rhythm Resp: clear to auscultation bilaterally Extremities: Intact sensation and capillary refill all digits.  +epl/fpl/io.  No wounds.  Pulses: 2+ and symmetric Skin: Skin color, texture, turgor normal. No rashes or lesions Neurologic: Grossly normal Incision/Wound: none  Assessment/Plan Right distal radius fracture.  Non operative and operative treatment options have been discussed with the patient and patient wishes to proceed with operative treatment. Risks, benefits, and alternatives of surgery have been discussed and the patient agrees with the plan of care. We also discussed possible need for bone graft and the  low but possible risk of disease transmission.  She voiced understanding of these risks and elected to proceed.   Ruth Ford 08/23/2020, 12:35 PM

## 2020-08-23 NOTE — Anesthesia Procedure Notes (Signed)
Anesthesia Regional Block: Supraclavicular block   Pre-Anesthetic Checklist: ,, timeout performed, Correct Patient, Correct Site, Correct Laterality, Correct Procedure, Correct Position, site marked, Risks and benefits discussed,  Surgical consent,  Pre-op evaluation,  At surgeon's request and post-op pain management  Laterality: Right  Prep: chloraprep       Needles:  Injection technique: Single-shot  Needle Type: Stimulator Needle - 80          Additional Needles:   Procedures:, nerve stimulator,,,,,,,  Narrative:  Start time: 08/23/2020 1:50 PM End time: 08/23/2020 2:00 PM Injection made incrementally with aspirations every 5 mL.  Performed by: Personally  Anesthesiologist: Kipp Brood, MD  Additional Notes: 25 cc 0.75% Ropivacaine injected easily

## 2020-08-23 NOTE — Op Note (Signed)
08/23/2020 [facility]  Operative Note  Pre Op Diagnosis: Right comminuted extraaarticular distal radius fracture  Post Op Diagnosis: Right comminuted extraarticular distal radius fracture  Procedure:  1. ORIF Right comminuted extraarticular distal radius fracture 2. Right brachioradialis release  Surgeon: [name], MD  Assistant: Cindee Salt, MD  Anesthesia: Regional with sedation  Fluids: Per anesthesia flow sheet  EBL: minimal  Complications: None  Specimen: None  Tourniquet Time:  Total Tourniquet Time Documented: Upper Arm (Right) - 76 minutes Total: Upper Arm (Right) - 76 minutes   Disposition: Stable to PACU  INDICATIONS:  Ruth Ford is a 87 y.o. female states she fell yesterday in her yard injuring her right arm.  She was seen with long emergency department where radiographs were taken revealing a distal radius fracture.  She was splinted and followed up in the office.  We discussed nonoperative and operative treatment options.  She wished to proceed with operative fixation.  Risks, benefits, and alternatives of surgery were discussed including the risk of blood loss; infection; damage to nerves, vessels, tendons, ligaments, bone; failure of surgery; need for additional surgery; complications with wound healing; continued pain; nonunion; malunion; stiffness.  We also discussed the possible need for bone graft and the benefits and risks including the possibility of disease transmission.  She voiced understanding of these risks and elected to proceed.    OPERATIVE COURSE:  After being identified preoperatively by myself, the patient and I agreed upon the procedure and site of procedure.  Surgical site was marked.   Surgical consent had been signed.  She was given IV Ancef as preoperative antibiotic prophylaxis.  She was transferred to the operating room and placed on the operating room table in supine position with the Right upper extremity on an armboard.  Sedation was induced by the anesthesiologist.  A regional block had been performed by anesthesia in preoperative holding.  The Right upper extremity was prepped and draped in normal sterile orthopedic fashion.  A surgical pause was performed between the surgeons, anesthesia and operating room staff, and all were in agreement as to the patient, procedure and site of procedure.  Tourniquet at the proximal aspect of the extremity was inflated to 250 mmHg after exsanguination of the limb with an Esmarch bandage.  Standard volar Sherilyn Cooter approach was used.  The bipolar electrocautery was used to obtain hemostasis.  The superficial and deep portions of the FCR tendon sheath were incised, and the FCR and FPL were swept ulnarly to protect the palmar cutaneous branch of the median nerve.  The brachioradialis was released at the radial side of the radius.  The pronator quadratus was released and elevated with the periosteal elevator.  The fracture site was identified and cleared of soft tissue interposition and hematoma.  It was reduced under direct visualization.  There was comminution of the metaphysis.  There were 2 fragments on the radial side and a smaller fragment on the ulnar side.  K wires were used to provisionally stabilize these fragments.  The fracture was reduced under direct visualization.  An Acumed narrow plate with a long shaft was selected and secured to the bone with guidepins.  C-arm was used in AP and lateral projections to ensure appropriate reduction position of the hardware.  The plate position was adjusted to gain appropriate fit.  C-arm was again used to ensure appropriate reduction and position of the hardware as was the case.  Standard AO drilling and measuring technique was used.  The holes  in the shaft of the plate were filled initially.  The proximal 3 holes were used.  The distal 2 holes were at the fracture site.  Good purchase was obtained.  The distal holes were filled with initially a  nonlocking screw at the ulnar side to bring the bone up to the plate.  The remaining holes were filled with locking pegs with the exception of the styloid holes which were filled with locking screws.  The nonlocking screw was then replaced with a locking peg.  Good reduction was obtained.  2 screws were then placed outside the plate.  Wound was across the distal aspect of the proximal portion of the radius to prevent propagation of a fracture line that was visible.  The second was to lag one of the butterfly fragments back to the shaft.  The provisional K wires were then removed.  C-arm was used in AP lateral and oblique projections to ensure appropriate reduction position of heart which was the case.  There was no intra-articular penetration of hardware.  The wound was copiously irrigated with sterile saline.  Pronator quadratus was repaired back over top of the plate using 4-0 Vicryl suture.  Vicryl suture was placed in the subcutaneous tissues in an inverted interrupted fashion and the skin was closed with 4-0 nylon in a horizontal mattress fashion.  There was good pronation and supination of the wrist without crepitance.  The wound was then dressed with sterile Xeroform, 4x4s, and wrapped with a Kerlix bandage.  A volar splint was placed and wrapped with Kerlix and Ace bandage.  Tourniquet was deflated at 76 minutes.  Fingertips were pink with brisk capillary refill after deflation of the tourniquet.  Operative drapes were broken down.  The patient was awoken from anesthesia safely.  She was transferred back to the stretcher and taken to the PACU in stable condition.  I will see her back in the office in one week for postoperative followup.  I will give her a prescription for Percocet 5/325 1 tab PO q6 hours prn pain, dispense # 20.    [name], MD Electronically signed, 08/23/20

## 2020-08-23 NOTE — Transfer of Care (Signed)
Immediate Anesthesia Transfer of Care Note  Patient: Ruth Ford  Procedure(s) Performed: OPEN REDUCTION INTERNAL FIXATION (ORIF) RIGHT DISTAL RADIAL FRACTURE (Right Wrist)  Patient Location: PACU  Anesthesia Type:MAC and MAC combined with regional for post-op pain  Level of Consciousness: awake, drowsy and patient cooperative  Airway & Oxygen Therapy: Patient Spontanous Breathing and Patient connected to face mask oxygen  Post-op Assessment: Report given to RN and Post -op Vital signs reviewed and stable  Post vital signs: Reviewed and stable  Last Vitals:  Vitals Value Taken Time  BP    Temp    Pulse 73 08/23/20 1657  Resp    SpO2 89 % 08/23/20 1657  Vitals shown include unvalidated device data.  Last Pain:  Vitals:   08/23/20 1400  TempSrc:   PainSc: 1       Patients Stated Pain Goal: 3 (88/87/57 [id number])  Complications: No complications documented.

## 2020-08-23 NOTE — Anesthesia Preprocedure Evaluation (Signed)
Anesthesia Evaluation  Patient identified by MRN, date of birth, ID band Patient awake    Reviewed: Allergy & Precautions, NPO status , Patient's Chart, lab work & pertinent test results  Airway Mallampati: II  TM Distance: >3 FB Neck ROM: Full    Dental  (+) Teeth Intact, Dental Advisory Given   Pulmonary    breath sounds clear to auscultation       Cardiovascular  Rhythm:Regular Rate:Normal     Neuro/Psych    GI/Hepatic   Endo/Other    Renal/GU      Musculoskeletal   Abdominal   Peds  Hematology   Anesthesia Other Findings   Reproductive/Obstetrics                             Anesthesia Physical Anesthesia Plan  ASA: III  Anesthesia Plan: MAC   Post-op Pain Management:  Regional for Post-op pain   Induction:   PONV Risk Score and Plan: Ondansetron and Dexamethasone  Airway Management Planned: Natural Airway and Simple Face Mask  Additional Equipment:   Intra-op Plan:   Post-operative Plan:   Informed Consent: I have reviewed the patients History and Physical, chart, labs and discussed the procedure including the risks, benefits and alternatives for the proposed anesthesia with the patient or authorized representative who has indicated his/her understanding and acceptance.     Dental advisory given  Plan Discussed with: Anesthesiologist and CRNA  Anesthesia Plan Comments:         Anesthesia Quick Evaluation

## 2020-08-23 NOTE — Discharge Instructions (Addendum)
Post Anesthesia Home Care Instructions  Activity: Get plenty of rest for the remainder of the day. A responsible individual must stay with you for 24 hours following the procedure.  For the next 24 hours, DO NOT: -Drive a car -Operate machinery -Drink alcoholic beverages -Take any medication unless instructed by your physician -Make any legal decisions or sign important papers.  Meals: Start with liquid foods such as gelatin or soup. Progress to regular foods as tolerated. Avoid greasy, spicy, heavy foods. If nausea and/or vomiting occur, drink only clear liquids until the nausea and/or vomiting subsides. Call your physician if vomiting continues.  Special Instructions/Symptoms: Your throat may feel dry or sore from the anesthesia or the breathing tube placed in your throat during surgery. If this causes discomfort, gargle with warm salt water. The discomfort should disappear within 24 hours.  If you had a scopolamine patch placed behind your ear for the management of post- operative nausea and/or vomiting:  1. The medication in the patch is effective for 72 hours, after which it should be removed.  Wrap patch in a tissue and discard in the trash. Wash hands thoroughly with soap and water. 2. You may remove the patch earlier than 72 hours if you experience unpleasant side effects which may include dry mouth, dizziness or visual disturbances. 3. Avoid touching the patch. Wash your hands with soap and water after contact with the patch.      Regional Anesthesia Blocks  1. Numbness or the inability to move the "blocked" extremity may last from 3-48 hours after placement. The length of time depends on the medication injected and your individual response to the medication. If the numbness is not going away after 48 hours, call your surgeon.  2. The extremity that is blocked will need to be protected until the numbness is gone and the  Strength has returned. Because you cannot feel it, you  will need to take extra care to avoid injury. Because it may be weak, you may have difficulty moving it or using it. You may not know what position it is in without looking at it while the block is in effect.  3. For blocks in the legs and feet, returning to weight bearing and walking needs to be done carefully. You will need to wait until the numbness is entirely gone and the strength has returned. You should be able to move your leg and foot normally before you try and bear weight or walk. You will need someone to be with you when you first try to ensure you do not fall and possibly risk injury.  4. Bruising and tenderness at the needle site are common side effects and will resolve in a few days.  5. Persistent numbness or new problems with movement should be communicated to the surgeon or the [facility] ([phone])/ [facility] ([phone]).    [facility] Instructions Hand Surgery  Wound Care: Keep your hand elevated above the level of your heart.  Do not allow it to dangle by your side.  Keep the dressing dry and do not remove it unless your doctor advises you to do so.  He will usually change it at the time of your post-op visit.  Moving your fingers is advised to stimulate circulation but will depend on the site of your surgery.  If you have a splint applied, your doctor will advise you regarding movement.  Activity: Do not drive or operate machinery today.  Rest today and   then you may return to your normal activity and work as indicated by your physician.  Diet:  Drink liquids today or eat a light diet.  You may resume a regular diet tomorrow.    General expectations: Pain for two to three days. Fingers may become slightly swollen.  Call your doctor if any of the following occur: Severe pain not relieved by pain medication. Elevated temperature. Dressing soaked with blood. Inability to move fingers. White or bluish color to fingers.  

## 2020-08-24 NOTE — Anesthesia Postprocedure Evaluation (Signed)
Anesthesia Post Note  Patient: Ruth Ford  Procedure(s) Performed: OPEN REDUCTION INTERNAL FIXATION (ORIF) RIGHT DISTAL RADIAL FRACTURE (Right Wrist)     Patient location during evaluation: PACU Anesthesia Type: MAC Level of consciousness: awake and alert Pain management: pain level controlled Vital Signs Assessment: post-procedure vital signs reviewed and stable Respiratory status: spontaneous breathing, nonlabored ventilation, respiratory function stable and patient connected to nasal cannula oxygen Cardiovascular status: stable and blood pressure returned to baseline Postop Assessment: no apparent nausea or vomiting Anesthetic complications: no   No complications documented.  Last Vitals:  Vitals:   08/23/20 1746 08/23/20 1800  BP: 128/66   Pulse: 63   Resp: 18   Temp: 37.1 C   SpO2: 97% 97%    Last Pain:  Vitals:   08/23/20 1746  TempSrc:   PainSc: 0-No pain                 Elinore Shults COKER

## 2020-08-26 NOTE — Addendum Note (Signed)
Addendum  created 08/26/20 1150 by Karen Kitchens, CRNA   Charge Capture section accepted

## 2020-08-27 ENCOUNTER — Encounter (HOSPITAL_BASED_OUTPATIENT_CLINIC_OR_DEPARTMENT_OTHER): Payer: Self-pay | Admitting: Orthopedic Surgery

## 2020-08-28 DIAGNOSIS — S52571A Other intraarticular fracture of lower end of right radius, initial encounter for closed fracture: Secondary | ICD-10-CM | POA: Diagnosis not present

## 2020-09-13 DIAGNOSIS — M25531 Pain in right wrist: Secondary | ICD-10-CM | POA: Diagnosis not present

## 2020-09-13 DIAGNOSIS — S52571A Other intraarticular fracture of lower end of right radius, initial encounter for closed fracture: Secondary | ICD-10-CM | POA: Diagnosis not present

## 2020-09-13 DIAGNOSIS — M25631 Stiffness of right wrist, not elsewhere classified: Secondary | ICD-10-CM | POA: Diagnosis not present

## 2020-09-20 DIAGNOSIS — S52501D Unspecified fracture of the lower end of right radius, subsequent encounter for closed fracture with routine healing: Secondary | ICD-10-CM | POA: Diagnosis not present

## 2020-09-20 DIAGNOSIS — M25531 Pain in right wrist: Secondary | ICD-10-CM | POA: Diagnosis not present

## 2020-09-20 DIAGNOSIS — M25631 Stiffness of right wrist, not elsewhere classified: Secondary | ICD-10-CM | POA: Diagnosis not present

## 2020-09-24 DIAGNOSIS — H6691 Otitis media, unspecified, right ear: Secondary | ICD-10-CM | POA: Diagnosis not present

## 2020-09-24 DIAGNOSIS — I10 Essential (primary) hypertension: Secondary | ICD-10-CM | POA: Diagnosis not present

## 2020-09-26 DIAGNOSIS — M25631 Stiffness of right wrist, not elsewhere classified: Secondary | ICD-10-CM | POA: Diagnosis not present

## 2020-10-02 DIAGNOSIS — S52571D Other intraarticular fracture of lower end of right radius, subsequent encounter for closed fracture with routine healing: Secondary | ICD-10-CM | POA: Diagnosis not present

## 2020-10-02 DIAGNOSIS — M25531 Pain in right wrist: Secondary | ICD-10-CM | POA: Diagnosis not present

## 2020-10-02 DIAGNOSIS — M25631 Stiffness of right wrist, not elsewhere classified: Secondary | ICD-10-CM | POA: Diagnosis not present

## 2020-10-02 DIAGNOSIS — S52501D Unspecified fracture of the lower end of right radius, subsequent encounter for closed fracture with routine healing: Secondary | ICD-10-CM | POA: Diagnosis not present

## 2020-10-25 DIAGNOSIS — Z23 Encounter for immunization: Secondary | ICD-10-CM | POA: Diagnosis not present

## 2020-11-13 DIAGNOSIS — S52571D Other intraarticular fracture of lower end of right radius, subsequent encounter for closed fracture with routine healing: Secondary | ICD-10-CM | POA: Diagnosis not present

## 2020-12-10 DIAGNOSIS — H40013 Open angle with borderline findings, low risk, bilateral: Secondary | ICD-10-CM | POA: Diagnosis not present

## 2020-12-27 DIAGNOSIS — F329 Major depressive disorder, single episode, unspecified: Secondary | ICD-10-CM | POA: Diagnosis not present

## 2020-12-27 DIAGNOSIS — M1991 Primary osteoarthritis, unspecified site: Secondary | ICD-10-CM | POA: Diagnosis not present

## 2021-01-23 DIAGNOSIS — Z789 Other specified health status: Secondary | ICD-10-CM | POA: Diagnosis not present

## 2021-01-23 DIAGNOSIS — H6123 Impacted cerumen, bilateral: Secondary | ICD-10-CM | POA: Diagnosis not present

## 2021-01-23 DIAGNOSIS — H6122 Impacted cerumen, left ear: Secondary | ICD-10-CM | POA: Diagnosis not present

## 2021-02-10 DIAGNOSIS — Z23 Encounter for immunization: Secondary | ICD-10-CM | POA: Diagnosis not present

## 2021-04-08 DIAGNOSIS — Z1331 Encounter for screening for depression: Secondary | ICD-10-CM | POA: Diagnosis not present

## 2021-04-08 DIAGNOSIS — Z6834 Body mass index (BMI) 34.0-34.9, adult: Secondary | ICD-10-CM | POA: Diagnosis not present

## 2021-04-08 DIAGNOSIS — F329 Major depressive disorder, single episode, unspecified: Secondary | ICD-10-CM | POA: Diagnosis not present

## 2021-04-08 DIAGNOSIS — F410 Panic disorder [episodic paroxysmal anxiety] without agoraphobia: Secondary | ICD-10-CM | POA: Diagnosis not present

## 2021-04-08 DIAGNOSIS — Z0000 Encounter for general adult medical examination without abnormal findings: Secondary | ICD-10-CM | POA: Diagnosis not present

## 2021-04-08 DIAGNOSIS — J301 Allergic rhinitis due to pollen: Secondary | ICD-10-CM | POA: Diagnosis not present

## 2021-04-08 DIAGNOSIS — E782 Mixed hyperlipidemia: Secondary | ICD-10-CM | POA: Diagnosis not present

## 2021-04-08 DIAGNOSIS — E6609 Other obesity due to excess calories: Secondary | ICD-10-CM | POA: Diagnosis not present

## 2021-04-08 DIAGNOSIS — E7849 Other hyperlipidemia: Secondary | ICD-10-CM | POA: Diagnosis not present

## 2021-04-08 DIAGNOSIS — R7309 Other abnormal glucose: Secondary | ICD-10-CM | POA: Diagnosis not present

## 2021-04-09 DIAGNOSIS — H40053 Ocular hypertension, bilateral: Secondary | ICD-10-CM | POA: Diagnosis not present

## 2021-07-04 DIAGNOSIS — R42 Dizziness and giddiness: Secondary | ICD-10-CM | POA: Diagnosis not present

## 2021-07-04 DIAGNOSIS — I10 Essential (primary) hypertension: Secondary | ICD-10-CM | POA: Diagnosis not present

## 2021-08-21 DIAGNOSIS — H40053 Ocular hypertension, bilateral: Secondary | ICD-10-CM | POA: Diagnosis not present

## 2021-12-16 DIAGNOSIS — H401131 Primary open-angle glaucoma, bilateral, mild stage: Secondary | ICD-10-CM | POA: Diagnosis not present

## 2022-01-22 DIAGNOSIS — Z23 Encounter for immunization: Secondary | ICD-10-CM | POA: Diagnosis not present

## 2022-02-16 DIAGNOSIS — Z23 Encounter for immunization: Secondary | ICD-10-CM | POA: Diagnosis not present

## 2022-04-13 DIAGNOSIS — H40053 Ocular hypertension, bilateral: Secondary | ICD-10-CM | POA: Diagnosis not present

## 2022-04-14 DIAGNOSIS — F410 Panic disorder [episodic paroxysmal anxiety] without agoraphobia: Secondary | ICD-10-CM | POA: Diagnosis not present

## 2022-04-14 DIAGNOSIS — Z1331 Encounter for screening for depression: Secondary | ICD-10-CM | POA: Diagnosis not present

## 2022-04-14 DIAGNOSIS — J301 Allergic rhinitis due to pollen: Secondary | ICD-10-CM | POA: Diagnosis not present

## 2022-04-14 DIAGNOSIS — Z0001 Encounter for general adult medical examination with abnormal findings: Secondary | ICD-10-CM | POA: Diagnosis not present

## 2022-04-14 DIAGNOSIS — E559 Vitamin D deficiency, unspecified: Secondary | ICD-10-CM | POA: Diagnosis not present

## 2022-04-14 DIAGNOSIS — Z6836 Body mass index (BMI) 36.0-36.9, adult: Secondary | ICD-10-CM | POA: Diagnosis not present

## 2022-04-14 DIAGNOSIS — E6609 Other obesity due to excess calories: Secondary | ICD-10-CM | POA: Diagnosis not present

## 2022-04-14 DIAGNOSIS — M1991 Primary osteoarthritis, unspecified site: Secondary | ICD-10-CM | POA: Diagnosis not present

## 2022-04-14 DIAGNOSIS — F329 Major depressive disorder, single episode, unspecified: Secondary | ICD-10-CM | POA: Diagnosis not present

## 2022-04-14 DIAGNOSIS — E782 Mixed hyperlipidemia: Secondary | ICD-10-CM | POA: Diagnosis not present

## 2022-04-14 DIAGNOSIS — E119 Type 2 diabetes mellitus without complications: Secondary | ICD-10-CM | POA: Diagnosis not present

## 2022-06-09 DIAGNOSIS — M79671 Pain in right foot: Secondary | ICD-10-CM | POA: Diagnosis not present

## 2022-06-09 DIAGNOSIS — S86091A Other specified injury of right Achilles tendon, initial encounter: Secondary | ICD-10-CM | POA: Diagnosis not present

## 2022-06-09 DIAGNOSIS — M7731 Calcaneal spur, right foot: Secondary | ICD-10-CM | POA: Diagnosis not present

## 2022-06-17 DIAGNOSIS — M19011 Primary osteoarthritis, right shoulder: Secondary | ICD-10-CM | POA: Diagnosis not present

## 2022-06-17 DIAGNOSIS — M25511 Pain in right shoulder: Secondary | ICD-10-CM | POA: Diagnosis not present

## 2022-07-27 DIAGNOSIS — M19011 Primary osteoarthritis, right shoulder: Secondary | ICD-10-CM | POA: Diagnosis not present

## 2022-08-25 DIAGNOSIS — H40053 Ocular hypertension, bilateral: Secondary | ICD-10-CM | POA: Diagnosis not present

## 2022-09-07 DIAGNOSIS — J0100 Acute maxillary sinusitis, unspecified: Secondary | ICD-10-CM | POA: Diagnosis not present

## 2023-02-09 DIAGNOSIS — Z23 Encounter for immunization: Secondary | ICD-10-CM | POA: Diagnosis not present

## 2023-02-24 DIAGNOSIS — H40053 Ocular hypertension, bilateral: Secondary | ICD-10-CM | POA: Diagnosis not present

## 2023-04-19 DIAGNOSIS — Z6836 Body mass index (BMI) 36.0-36.9, adult: Secondary | ICD-10-CM | POA: Diagnosis not present

## 2023-04-19 DIAGNOSIS — M1991 Primary osteoarthritis, unspecified site: Secondary | ICD-10-CM | POA: Diagnosis not present

## 2023-04-19 DIAGNOSIS — E119 Type 2 diabetes mellitus without complications: Secondary | ICD-10-CM | POA: Diagnosis not present

## 2023-04-19 DIAGNOSIS — F410 Panic disorder [episodic paroxysmal anxiety] without agoraphobia: Secondary | ICD-10-CM | POA: Diagnosis not present

## 2023-04-19 DIAGNOSIS — Z1331 Encounter for screening for depression: Secondary | ICD-10-CM | POA: Diagnosis not present

## 2023-04-19 DIAGNOSIS — F329 Major depressive disorder, single episode, unspecified: Secondary | ICD-10-CM | POA: Diagnosis not present

## 2023-04-19 DIAGNOSIS — Z0001 Encounter for general adult medical examination with abnormal findings: Secondary | ICD-10-CM | POA: Diagnosis not present

## 2023-04-19 DIAGNOSIS — E6609 Other obesity due to excess calories: Secondary | ICD-10-CM | POA: Diagnosis not present

## 2023-04-19 DIAGNOSIS — R7309 Other abnormal glucose: Secondary | ICD-10-CM | POA: Diagnosis not present

## 2023-04-19 DIAGNOSIS — E782 Mixed hyperlipidemia: Secondary | ICD-10-CM | POA: Diagnosis not present

## 2023-07-02 DIAGNOSIS — Z23 Encounter for immunization: Secondary | ICD-10-CM | POA: Diagnosis not present

## 2023-08-25 DIAGNOSIS — H40013 Open angle with borderline findings, low risk, bilateral: Secondary | ICD-10-CM | POA: Diagnosis not present

## 2023-09-23 DIAGNOSIS — M1991 Primary osteoarthritis, unspecified site: Secondary | ICD-10-CM | POA: Diagnosis not present

## 2024-01-13 DIAGNOSIS — F410 Panic disorder [episodic paroxysmal anxiety] without agoraphobia: Secondary | ICD-10-CM | POA: Diagnosis not present

## 2024-02-14 DIAGNOSIS — F410 Panic disorder [episodic paroxysmal anxiety] without agoraphobia: Secondary | ICD-10-CM | POA: Diagnosis not present

## 2024-02-14 DIAGNOSIS — G8929 Other chronic pain: Secondary | ICD-10-CM | POA: Diagnosis not present

## 2024-02-14 DIAGNOSIS — E785 Hyperlipidemia, unspecified: Secondary | ICD-10-CM | POA: Diagnosis not present

## 2024-02-14 DIAGNOSIS — E119 Type 2 diabetes mellitus without complications: Secondary | ICD-10-CM | POA: Diagnosis not present

## 2024-02-14 DIAGNOSIS — F322 Major depressive disorder, single episode, severe without psychotic features: Secondary | ICD-10-CM | POA: Diagnosis not present

## 2024-02-14 DIAGNOSIS — Z23 Encounter for immunization: Secondary | ICD-10-CM | POA: Diagnosis not present

## 2024-02-16 DIAGNOSIS — Z23 Encounter for immunization: Secondary | ICD-10-CM | POA: Diagnosis not present

## 2024-02-23 DIAGNOSIS — H401131 Primary open-angle glaucoma, bilateral, mild stage: Secondary | ICD-10-CM | POA: Diagnosis not present
